# Patient Record
Sex: Female | Born: 1990 | Race: Black or African American | Hispanic: No | Marital: Single | Smoking: Former smoker
Health system: Southern US, Community
[De-identification: ages and names within clinical notes are randomized; demographics above are authoritative.]

## PROBLEM LIST (undated history)

## (undated) DIAGNOSIS — I10 Essential (primary) hypertension: Secondary | ICD-10-CM

## (undated) DIAGNOSIS — Z8742 Personal history of other diseases of the female genital tract: Secondary | ICD-10-CM

## (undated) DIAGNOSIS — R591 Generalized enlarged lymph nodes: Secondary | ICD-10-CM

## (undated) DIAGNOSIS — Z9189 Other specified personal risk factors, not elsewhere classified: Secondary | ICD-10-CM

## (undated) DIAGNOSIS — B009 Herpesviral infection, unspecified: Secondary | ICD-10-CM

## (undated) DIAGNOSIS — F32A Depression, unspecified: Secondary | ICD-10-CM

## (undated) HISTORY — DX: Other specified personal risk factors, not elsewhere classified: Z91.89

## (undated) HISTORY — DX: Generalized enlarged lymph nodes: R59.1

## (undated) HISTORY — DX: Personal history of other diseases of the female genital tract: Z87.42

## (undated) HISTORY — DX: Depression, unspecified: F32.A

---
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
## 2013-06-24 ENCOUNTER — Emergency Department: Payer: Self-pay | Admitting: Emergency Medicine

## 2013-09-11 ENCOUNTER — Emergency Department: Payer: Self-pay | Admitting: Emergency Medicine

## 2013-09-11 LAB — COMPREHENSIVE METABOLIC PANEL
Albumin: 3.8 g/dL (ref 3.4–5.0)
Alkaline Phosphatase: 51 U/L (ref 50–136)
Anion Gap: 4 — ABNORMAL LOW (ref 7–16)
Bilirubin,Total: 0.5 mg/dL (ref 0.2–1.0)
Calcium, Total: 9.3 mg/dL (ref 8.5–10.1)
Chloride: 108 mmol/L — ABNORMAL HIGH (ref 98–107)
Co2: 26 mmol/L (ref 21–32)
Creatinine: 0.97 mg/dL (ref 0.60–1.30)
EGFR (African American): >60
EGFR (Non-African Amer.): >60
Glucose: 89 mg/dL (ref 65–99)
Osmolality: 274 (ref 275–301)
Potassium: 4 mmol/L (ref 3.5–5.1)
SGOT(AST): 19 U/L (ref 15–37)
SGPT (ALT): 19 U/L (ref 12–78)
Total Protein: 7.8 g/dL (ref 6.4–8.2)

## 2013-09-11 LAB — URINALYSIS, COMPLETE
Bacteria: NONE SEEN
Bilirubin,UR: NEGATIVE
Glucose,UR: NEGATIVE mg/dL (ref 0–75)
Nitrite: NEGATIVE
Ph: 6 (ref 4.5–8.0)
RBC,UR: <1 /HPF (ref 0–5)
Squamous Epithelial: 1
WBC UR: 1 /HPF (ref 0–5)

## 2013-09-11 LAB — WET PREP, GENITAL

## 2013-09-11 LAB — CBC
HGB: 13.5 g/dL (ref 12.0–16.0)
MCHC: 33.2 g/dL (ref 32.0–36.0)
Platelet: 221 10*3/uL (ref 150–440)
RBC: 4.82 10*6/uL (ref 3.80–5.20)
WBC: 5.8 10*3/uL (ref 3.6–11.0)

## 2014-03-29 ENCOUNTER — Emergency Department: Payer: Self-pay | Admitting: Emergency Medicine

## 2015-01-01 ENCOUNTER — Emergency Department: Payer: Self-pay | Admitting: Emergency Medicine

## 2015-04-15 ENCOUNTER — Emergency Department: Admit: 2015-04-15 | Disposition: A | Discharge status: Home / Self Care | Payer: Self-pay | Admitting: Internal Medicine

## 2015-04-17 LAB — BETA STREP CULTURE(ARMC)

## 2015-05-19 ENCOUNTER — Encounter: Payer: Self-pay | Admitting: Emergency Medicine

## 2015-05-19 ENCOUNTER — Emergency Department
Admission: EM | Admit: 2015-05-19 | Discharge: 2015-05-19 | Disposition: A | Discharge status: Home / Self Care | Payer: Self-pay | Attending: Emergency Medicine | Admitting: Emergency Medicine

## 2015-05-19 DIAGNOSIS — N939 Abnormal uterine and vaginal bleeding, unspecified: Secondary | ICD-10-CM | POA: Insufficient documentation

## 2015-05-19 DIAGNOSIS — M545 Low back pain: Secondary | ICD-10-CM | POA: Insufficient documentation

## 2015-05-19 DIAGNOSIS — Z3202 Encounter for pregnancy test, result negative: Secondary | ICD-10-CM | POA: Insufficient documentation

## 2015-05-19 LAB — CBC
HCT: 43.6 % (ref 35.0–47.0)
Hemoglobin: 14.3 g/dL (ref 12.0–16.0)
MCH: 27.8 pg (ref 26.0–34.0)
MCHC: 32.8 g/dL (ref 32.0–36.0)
MCV: 84.8 fL (ref 80.0–100.0)
PLATELETS: 241 10*3/uL (ref 150–440)
RBC: 5.14 MIL/uL (ref 3.80–5.20)
RDW: 13.8 % (ref 11.5–14.5)
WBC: 7.2 10*3/uL (ref 3.6–11.0)

## 2015-05-19 LAB — URINALYSIS COMPLETE WITH MICROSCOPIC (ARMC ONLY)
BILIRUBIN URINE: NEGATIVE
Bacteria, UA: NONE SEEN
Glucose, UA: NEGATIVE mg/dL
Ketones, ur: NEGATIVE mg/dL
Leukocytes, UA: NEGATIVE
Nitrite: NEGATIVE
PROTEIN: NEGATIVE mg/dL
RBC / HPF: NONE SEEN RBC/hpf (ref 0–5)
Specific Gravity, Urine: 1.024 (ref 1.005–1.030)
pH: 5 (ref 5.0–8.0)

## 2015-05-19 LAB — CHLAMYDIA/NGC RT PCR (ARMC ONLY)
Chlamydia Tr: NOT DETECTED
N gonorrhoeae: NOT DETECTED

## 2015-05-19 LAB — WET PREP, GENITAL
Clue Cells Wet Prep HPF POC: NONE SEEN
Trich, Wet Prep: NONE SEEN
Yeast Wet Prep HPF POC: NONE SEEN

## 2015-05-19 LAB — POCT PREGNANCY, URINE: Preg Test, Ur: NEGATIVE

## 2015-05-19 MED ORDER — IBUPROFEN 800 MG PO TABS
800.0000 mg | ORAL_TABLET | Freq: Once | ORAL | Status: AC
  Administered 2015-05-19: 800 mg via ORAL

## 2015-05-19 MED ORDER — IBUPROFEN 800 MG PO TABS
ORAL_TABLET | ORAL | Status: AC
  Filled 2015-05-19: qty 1

## 2015-05-19 NOTE — ED Provider Notes (Signed)
[facility] Emergency Department Provider Note   ____________________________________________  Time seen: 3:00 PM I have reviewed the triage vital signs and the triage nursing note.  HISTORY  Chief Complaint Vaginal Bleeding   Historian Patient  HPI Paige White is a 24 y.o. female who is complaining of heavy vaginal leading for about 1 month. Symptoms are moderate. She did come due for her next dose of Depo-Provera injection in April, and did not get it. She has not had problems with irregular bleeding that she can remember, since she had been using Depo-Provera. She's had some generalized sleepiness, and generalized weakness, but no focal weakness. She has not been ill with a fever, headache, chest pain, shortness of breath, abdominal pain other than cramping recently. She's had no joint problems.     No past medical history on file.  obesity  There are no active problems to display for this patient.   No past surgical history on file.  No current outpatient prescriptions on file.  Depo-Provera, expired  Allergies Review of patient's allergies indicates no known allergies.  No family history on file.  Social History History  Substance Use Topics  . Smoking status: Never Smoker   . Smokeless tobacco: Not on file  . Alcohol Use: Yes    Review of Systems  Constitutional: Negative for fever. Eyes: Negative for visual changes. ENT: Negative for sore throat. Cardiovascular: Negative for chest pain. Respiratory: Negative for shortness of breath. Gastrointestinal: Positive for abdominal cramping and mild low back pain. Genitourinary: Negative for dysuria. Musculoskeletal: Negative for joint problems.  Skin: Negative for rash. Neurological: Negative for headaches, focal weakness or numbness.  ____________________________________________   PHYSICAL EXAM:  VITAL SIGNS: ED Triage Vitals  Enc Vitals Group     BP 05/19/15 1303 147/76  mmHg     Pulse Rate 05/19/15 1303 85     Resp 05/19/15 1303 18     Temp 05/19/15 1303 99.1 F (37.3 C)     Temp Source 05/19/15 1303 Oral     SpO2 05/19/15 1303 95 %     Weight 05/19/15 1303 206 lb (93.441 kg)     Height 05/19/15 1303 5\' 3"  (1.6 m)     Head Cir --      Peak Flow --      Pain Score 05/19/15 1304 7     Pain Loc --      Pain Edu? --      Excl. in [place]? --      Constitutional: Alert and oriented. Well appearing and in no distress. Eyes: Conjunctivae are normal. PERRL. Normal extraocular movements. ENT   Head: Normocephalic and atraumatic.   Nose: No congestion/rhinnorhea.   Mouth/Throat: Mucous membranes are moist.   Neck: No stridor. Cardiovascular: Normal rate, regular rhythm.  No murmurs, rubs, or gallops. Respiratory: Normal respiratory effort without tachypnea nor retractions. Breath sounds are clear and equal bilaterally. No wheezes/rales/rhonchi. Gastrointestinal: Soft and nontender. No distention.  Genitourinary: Scant vaginal bleeding, no vaginal discharge. No cervicitis. No adnexal masses or tenderness. Musculoskeletal: Nontender with normal range of motion in all extremities. No joint effusions.  No lower extremity tenderness nor edema. Neurologic:  Normal speech and language. No gross focal neurologic deficits are appreciated. Skin:  Skin is warm, dry and intact. No rash noted. Psychiatric: Mood and affect are normal. Speech and behavior are normal. Patient exhibits appropriate insight and judgment.  ____________________________________________   EKG  None ____________________________________________  LABS (pertinent positives/negatives)  Pregnancy test negative  White blood cell count 7.2 Hemoglobin 14.3 Urinalysis negative  ____________________________________________  RADIOLOGY Radiologist results reviewed  None __________________________________________  PROCEDURES  Procedure(s) performed: None Critical Care performed:  None  ____________________________________________   ED COURSE / ASSESSMENT AND PLAN  Pertinent labs & imaging results that were available during my care of the patient were reviewed by me and considered in my medical decision making (see chart for details).  Patient is overall well-appearing, with stable vital signs, and no anemia. She is having symptoms related to dysmenorrhea and menorrhagia, however she is stable. I will refer her to [place] [facility] for consideration of hormonal birth control options. Return precautions and discharge instructions provided the patient. She is comfortable and agreeable to this plan.    ___________________________________________   FINAL CLINICAL IMPRESSION(S) / ED DIAGNOSES   Final diagnoses:  Episode of heavy vaginal bleeding      Lisa Roca, MD 05/19/15 1523

## 2015-05-19 NOTE — Discharge Instructions (Signed)
You were evaluated for heavy vaginal bleeding for almost one month as Depo-Provera was due. We discussed her examine evaluation are reassuring today. I'm recommending follow-up with OB/GYN, or the health department for consideration of restarting Depo-Provera, or another hormonal birth control, as this will help with irregular bleeding as well as progressive prevention. Return to the emergency department for any new or worsening bleeding soaking through 1 pad per hour for 2-3 hours, pain, weakness, numbness, dizziness, or passing out.  Dysfunctional Uterine Bleeding Normally, menstrual periods begin between ages 50 to 75 in young women. A normal menstrual cycle/period may begin every 23 days up to 35 days and lasts from 1 to 7 days. Around 12 to 14 days before your menstrual period starts, ovulation (ovary produces an egg) occurs. When counting the time between menstrual periods, count from the first day of bleeding of the previous period to the first day of bleeding of the next period. Dysfunctional (abnormal) uterine bleeding is bleeding that is different from a normal menstrual period. Your periods may come earlier or later than usual. They may be lighter, have blood clots or be heavier. You may have bleeding between periods, or you may skip one period or more. You may have bleeding after sexual intercourse, bleeding after menopause, or no menstrual period. CAUSES   Pregnancy (normal, miscarriage, tubal).  IUDs (intrauterine device, birth control).  Birth control pills.  Hormone treatment.  Menopause.  Infection of the cervix.  Blood clotting problems.  Infection of the inside lining of the uterus.  Endometriosis, inside lining of the uterus growing in the pelvis and other female organs.  Adhesions (scar tissue) inside the uterus.  Obesity or severe weight loss.  Uterine polyps inside the uterus.  Cancer of the vagina, cervix, or uterus.  Ovarian cysts or polycystic ovary  syndrome.  Medical problems (diabetes, thyroid disease).  Uterine fibroids (noncancerous tumor).  Problems with your female hormones.  Endometrial hyperplasia, very thick lining and enlarged cells inside of the uterus.  Medicines that interfere with ovulation.  Radiation to the pelvis or abdomen.  Chemotherapy. DIAGNOSIS   Your doctor will discuss the history of your menstrual periods, medicines you are taking, changes in your weight, stress in your life, and any medical problems you may have.  Your doctor will do a physical and pelvic examination.  Your doctor may want to perform certain tests to make a diagnosis, such as:  Pap test.  Blood tests.  Cultures for infection.  CT scan.  Ultrasound.  Hysteroscopy.  Laparoscopy.  MRI.  Hysterosalpingography.  D and C.  Endometrial biopsy. TREATMENT  Treatment will depend on the cause of the dysfunctional uterine bleeding (DUB). Treatment may include:  Observing your menstrual periods for a couple of months.  Prescribing medicines for medical problems, including:  Antibiotics.  Hormones.  Birth control pills.  Removing an IUD (intrauterine device, birth control).  Surgery:  D and C (scrape and remove tissue from inside the uterus).  Laparoscopy (examine inside the abdomen with a lighted tube).  Uterine ablation (destroy lining of the uterus with electrical current, laser, heat, or freezing).  Hysteroscopy (examine cervix and uterus with a lighted tube).  Hysterectomy (remove the uterus). HOME CARE INSTRUCTIONS   If medicines were prescribed, take exactly as directed. Do not change or switch medicines without consulting your caregiver.  Long term heavy bleeding may result in iron deficiency. Your caregiver may have prescribed iron pills. They help replace the iron that your body lost from heavy bleeding.  Take exactly as directed.  Do not take aspirin or medicines that contain aspirin one week  before or during your menstrual period. Aspirin may make the bleeding worse.  If you need to change your sanitary pad or tampon more than once every 2 hours, stay in bed with your feet elevated and a cold pack on your lower abdomen. Rest as much as possible, until the bleeding stops or slows down.  Eat well-balanced meals. Eat foods high in iron. Examples are:  Leafy green vegetables.  Whole-grain breads and cereals.  Eggs.  Meat.  Liver.  Do not try to lose weight until the abnormal bleeding has stopped and your blood iron level is back to normal. Do not lift more than ten pounds or do strenuous activities when you are bleeding.  For a couple of months, make note on your calendar, marking the start and ending of your period, and the type of bleeding (light, medium, heavy, spotting, clots or missed periods). This is for your caregiver to better evaluate your problem. SEEK MEDICAL CARE IF:   You develop nausea (feeling sick to your stomach) and vomiting, dizziness, or diarrhea while you are taking your medicine.  You are getting lightheaded or weak.  You have any problems that may be related to the medicine you are taking.  You develop pain with your DUB.  You want to remove your IUD.  You want to stop or change your birth control pills or hormones.  You have any type of abnormal bleeding mentioned above.  You are over 79 years old and have not had a menstrual period yet.  You are 24 years old and you are still having menstrual periods.  You have any of the symptoms mentioned above.  You develop a rash. SEEK IMMEDIATE MEDICAL CARE IF:   An oral temperature above 102 F (38.9 C) develops.  You develop chills.  You are changing your sanitary pad or tampon more than once an hour.  You develop abdominal pain.  You pass out or faint. Document Released: 12/03/2000 Document Revised: 02/28/2012 Document Reviewed: 11/04/2009 [facility] Patient Information 2015 [place],  [place]. This information is not intended to replace advice given to you by your health care provider. Make sure you discuss any questions you have with your health care provider.

## 2015-05-19 NOTE — ED Notes (Signed)
C/o heavy menstrual bleeding x 29 days with lower abd. pain

## 2015-10-14 ENCOUNTER — Emergency Department
Admission: EM | Admit: 2015-10-14 | Discharge: 2015-10-14 | Disposition: A | Discharge status: Home / Self Care | Payer: Self-pay | Attending: Emergency Medicine | Admitting: Emergency Medicine

## 2015-10-14 ENCOUNTER — Encounter: Payer: Self-pay | Admitting: Emergency Medicine

## 2015-10-14 ENCOUNTER — Emergency Department: Payer: Self-pay

## 2015-10-14 DIAGNOSIS — R519 Headache, unspecified: Secondary | ICD-10-CM

## 2015-10-14 DIAGNOSIS — R51 Headache: Secondary | ICD-10-CM | POA: Insufficient documentation

## 2015-10-14 HISTORY — DX: Herpesviral infection, unspecified: B00.9

## 2015-10-14 MED ORDER — IBUPROFEN 800 MG PO TABS: 800.0000 mg | ORAL_TABLET | Freq: Three times a day (TID) | ORAL | Status: DC | PRN

## 2015-10-14 MED ORDER — PROMETHAZINE HCL 25 MG PO TABS: 25.0000 mg | ORAL_TABLET | Freq: Four times a day (QID) | ORAL | Status: DC | PRN

## 2015-10-14 MED ORDER — HYDROCODONE-ACETAMINOPHEN 5-325 MG PO TABS
2.0000 | ORAL_TABLET | Freq: Once | ORAL | Status: AC
  Administered 2015-10-14: 2 via ORAL
  Filled 2015-10-14: qty 2

## 2015-10-14 MED ORDER — KETOROLAC TROMETHAMINE 30 MG/ML IJ SOLN
30.0000 mg | Freq: Once | INTRAMUSCULAR | Status: AC
  Administered 2015-10-14: 30 mg via INTRAVENOUS
  Filled 2015-10-14: qty 1

## 2015-10-14 MED ORDER — METOCLOPRAMIDE HCL 10 MG PO TABS
10.0000 mg | ORAL_TABLET | Freq: Once | ORAL | Status: AC
  Administered 2015-10-14: 10 mg via ORAL
  Filled 2015-10-14: qty 1

## 2015-10-14 MED ORDER — BUTALBITAL-APAP-CAFFEINE 50-325-40 MG PO TABS: 1.0000 | ORAL_TABLET | Freq: Four times a day (QID) | ORAL | Status: DC | PRN

## 2015-10-14 NOTE — ED Provider Notes (Signed)
[facility] Emergency Department Provider Note  ____________________________________________  Time seen: Approximately 11:20 AM  I have reviewed the triage vital signs and the nursing notes.   HISTORY  Chief Complaint Headache    HPI Paige White is a 24 y.o. female who presents for evaluation of headache for 3 weeks. Patient states it's worse today than it has been the last 3 weeks. Has tried over-the-counter medication with no relief. Denies any blurred vision or visual changes. Patient states that the headache is worsened by orders and sound. Past medical history of headaches. Describes pain as a 10 over 10 at this point.   Past Medical History  Diagnosis Date  . Herpes     There are no active problems to display for this patient.   History reviewed. No pertinent past surgical history.  Current Outpatient Rx  Name  Route  Sig  Dispense  Refill  . butalbital-acetaminophen-caffeine (FIORICET) 50-325-40 MG tablet   Oral   Take 1-2 tablets by mouth every 6 (six) hours as needed for headache.   20 tablet   0   . ibuprofen (ADVIL,MOTRIN) 800 MG tablet   Oral   Take 1 tablet (800 mg total) by mouth every 8 (eight) hours as needed.   30 tablet   0   . promethazine (PHENERGAN) 25 MG tablet   Oral   Take 1 tablet (25 mg total) by mouth every 6 (six) hours as needed for nausea or vomiting.   30 tablet   0     Allergies Review of patient's allergies indicates no known allergies.  History reviewed. No pertinent family history.  Social History Social History  Substance Use Topics  . Smoking status: Never Smoker   . Smokeless tobacco: None  . Alcohol Use: Yes    Review of Systems Constitutional: No fever/chills Eyes: No visual changes. ENT: No sore throat. Cardiovascular: Denies chest pain. Respiratory: Denies shortness of breath. Gastrointestinal: No abdominal pain.  No nausea, no vomiting.  No diarrhea.  No  constipation. Genitourinary: Negative for dysuria. Musculoskeletal: Negative for back pain. Skin: Negative for rash. Neurological: Positive for headaches, negative for focal weakness or numbness.  10-point ROS otherwise negative.  ____________________________________________   PHYSICAL EXAM:  VITAL SIGNS: ED Triage Vitals  Enc Vitals Group     BP 10/14/15 1018 125/80 mmHg     Pulse Rate 10/14/15 1018 60     Resp 10/14/15 1018 20     Temp 10/14/15 1018 98.4 F (36.9 C)     Temp Source 10/14/15 1018 Oral     SpO2 10/14/15 1018 100 %     Weight 10/14/15 1018 203 lb (92.08 kg)     Height 10/14/15 1018 5\' 3"  (1.6 m)     Head Cir --      Peak Flow --      Pain Score 10/14/15 1026 10     Pain Loc --      Pain Edu? --      Excl. in [place]? --     Constitutional: Alert and oriented. Well appearing and in no acute distress. Eyes: Conjunctivae are normal. PERRL. EOMI. Head: Atraumatic. Nose: No congestion/rhinnorhea. Mouth/Throat: Mucous membranes are moist.  Oropharynx non-erythematous. Neck: No stridor.  Negative spinal tenderness. Cardiovascular: Normal rate, regular rhythm. Grossly normal heart sounds.  Good peripheral circulation. Respiratory: Normal respiratory effort.  No retractions. Lungs CTAB. Gastrointestinal: Soft and nontender. No distention. No abdominal bruits. No CVA tenderness. Musculoskeletal: No lower extremity tenderness nor  edema.  No joint effusions. Neurologic:  Normal speech and language. No gross focal neurologic deficits are appreciated. No gait instability. Skin:  Skin is warm, dry and intact. No rash noted. Psychiatric: Mood and affect are normal. Speech and behavior are normal.  ____________________________________________   LABS (all labs ordered are listed, but only abnormal results are displayed)  Labs Reviewed - No data to display ____________________________________________  RADIOLOGY  Head CT  negative. ____________________________________________   PROCEDURES  Procedure(s) performed: None  Critical Care performed: No  ____________________________________________   INITIAL IMPRESSION / ASSESSMENT AND PLAN / ED COURSE  Pertinent labs & imaging results that were available during my care of the patient were reviewed by me and considered in my medical decision making (see chart for details).  Initial treatment in the ER was IV saline lock with ketorolac 30 mg IV push, Reglan 10 mg, hydrocodone 5/325 #2 given. Patient reports relief with previous medication. Rx provided for Fioricet, Motrin 800, promethazine as needed for nausea. Work excuse given times one day she is to follow up with her PCP or return to the ER with any worsening symptomology. No other emergency medical complaints at this time. ____________________________________________   FINAL CLINICAL IMPRESSION(S) / ED DIAGNOSES  Final diagnoses:  Acute nonintractable headache, unspecified headache type      Arlyss Repress, PA-C 10/14/15 1308  Earleen Newport, MD 10/14/15 [phone]

## 2015-10-14 NOTE — Discharge Instructions (Signed)

## 2015-10-14 NOTE — ED Notes (Signed)
Pt to ed with c/o headache x 3 weeks, pt states today worse than last three weeks,  States has tried over the counter headache pills without relief.  Denies blurred vision,  Pt states odors make headache worse, also reports increase in pain with loud noise.

## 2015-10-14 NOTE — ED Notes (Signed)
States she has had a headache for about 2-3 weeks   denies any fever injury or nausea or vomiting.  Min relief with OTC meds  States pain  Is frontal area

## 2015-11-18 ENCOUNTER — Emergency Department
Admission: EM | Admit: 2015-11-18 | Discharge: 2015-11-18 | Disposition: A | Discharge status: Home / Self Care | Payer: Self-pay | Attending: Emergency Medicine | Admitting: Emergency Medicine

## 2015-11-18 ENCOUNTER — Encounter: Payer: Self-pay | Admitting: Emergency Medicine

## 2015-11-18 DIAGNOSIS — Z3202 Encounter for pregnancy test, result negative: Secondary | ICD-10-CM | POA: Insufficient documentation

## 2015-11-18 DIAGNOSIS — N938 Other specified abnormal uterine and vaginal bleeding: Secondary | ICD-10-CM | POA: Insufficient documentation

## 2015-11-18 LAB — CBC WITH DIFFERENTIAL/PLATELET
Basophils Absolute: 0 10*3/uL (ref 0–0.1)
Basophils Relative: 1 %
EOS ABS: 0.1 10*3/uL (ref 0–0.7)
EOS PCT: 2 %
HCT: 43.2 % (ref 35.0–47.0)
Hemoglobin: 14 g/dL (ref 12.0–16.0)
Lymphocytes Relative: 49 %
Lymphs Abs: 2.7 10*3/uL (ref 1.0–3.6)
MCH: 27.3 pg (ref 26.0–34.0)
MCHC: 32.5 g/dL (ref 32.0–36.0)
MCV: 83.8 fL (ref 80.0–100.0)
MONOS PCT: 7 %
Monocytes Absolute: 0.4 10*3/uL (ref 0.2–0.9)
Neutro Abs: 2.2 10*3/uL (ref 1.4–6.5)
Neutrophils Relative %: 41 %
PLATELETS: 246 10*3/uL (ref 150–440)
RBC: 5.15 MIL/uL (ref 3.80–5.20)
RDW: 13.5 % (ref 11.5–14.5)
WBC: 5.3 10*3/uL (ref 3.6–11.0)

## 2015-11-18 LAB — COMPREHENSIVE METABOLIC PANEL
ALT: 14 U/L (ref 14–54)
ANION GAP: 6 (ref 5–15)
AST: 15 U/L (ref 15–41)
Albumin: 3.8 g/dL (ref 3.5–5.0)
Alkaline Phosphatase: 47 U/L (ref 38–126)
BUN: 13 mg/dL (ref 6–20)
CALCIUM: 9.6 mg/dL (ref 8.9–10.3)
CHLORIDE: 106 mmol/L (ref 101–111)
CO2: 25 mmol/L (ref 22–32)
Creatinine, Ser: 0.94 mg/dL (ref 0.44–1.00)
GFR calc non Af Amer: >60 mL/min (ref >60)
GLUCOSE: 95 mg/dL (ref 65–99)
POTASSIUM: 4.3 mmol/L (ref 3.5–5.1)
SODIUM: 137 mmol/L (ref 135–145)
TOTAL PROTEIN: 8.1 g/dL (ref 6.5–8.1)
Total Bilirubin: 0.9 mg/dL (ref 0.3–1.2)

## 2015-11-18 LAB — URINALYSIS COMPLETE WITH MICROSCOPIC (ARMC ONLY)
Bilirubin Urine: NEGATIVE
GLUCOSE, UA: NEGATIVE mg/dL
Ketones, ur: NEGATIVE mg/dL
Nitrite: NEGATIVE
Protein, ur: NEGATIVE mg/dL
Specific Gravity, Urine: 1.006 (ref 1.005–1.030)
pH: 6 (ref 5.0–8.0)

## 2015-11-18 LAB — POCT PREGNANCY, URINE: PREG TEST UR: NEGATIVE

## 2015-11-18 LAB — LIPASE, BLOOD: LIPASE: 25 U/L (ref 11–51)

## 2015-11-18 NOTE — ED Provider Notes (Signed)
[facility] Emergency Department Provider Note  ____________________________________________   I have reviewed the triage vital signs and the nursing notes.   HISTORY  Chief Complaint Abdominal Pain and Vaginal Bleeding    HPI Paige White is a 24 y.o. female who states that essentially she is here for a Depo-Provera shot. She was scheduled to get her Depo-Provera shot tomorrow at the pharmacy but they informed her there was a problem with her insurance. Patient is due for her Depo-Provera shot. Her last Depo-Provera shot was 3 or 4 months ago she states. Records the end of her Depo-Provera shot period, The patient has increased vaginal bleeding. She does not feel lightheaded or have any secondary concerns. She is not having vaginal discharge she does not want a pelvic exam. She would like a Depo-Provera shot. She states that she goes to the [facility]. Initially she said she is using 3 pads an hour but when I questioned her more closely in terms that she is only used 1 tampon today and had to use 3 tampons yesterday. Patient is using her cell phone and needs to be prompted several times to answer questions.  Past Medical History  Diagnosis Date  . Herpes     There are no active problems to display for this patient.   History reviewed. No pertinent past surgical history.  Current Outpatient Rx  Name  Route  Sig  Dispense  Refill  . butalbital-acetaminophen-caffeine (FIORICET) 50-325-40 MG tablet   Oral   Take 1-2 tablets by mouth every 6 (six) hours as needed for headache.   20 tablet   0   . ibuprofen (ADVIL,MOTRIN) 800 MG tablet   Oral   Take 1 tablet (800 mg total) by mouth every 8 (eight) hours as needed.   30 tablet   0   . promethazine (PHENERGAN) 25 MG tablet   Oral   Take 1 tablet (25 mg total) by mouth every 6 (six) hours as needed for nausea or vomiting.   30 tablet   0     Allergies Review of patient's allergies indicates no  known allergies.  No family history on file.  Social History Social History  Substance Use Topics  . Smoking status: Never Smoker   . Smokeless tobacco: None  . Alcohol Use: Yes    Review of Systems Constitutional: No fever/chills Eyes: No visual changes. ENT: No sore throat. No stiff neck no neck pain Cardiovascular: Denies chest pain. Respiratory: Denies shortness of breath. Gastrointestinal:   no vomiting.  No diarrhea.  No constipation. Genitourinary: Negative for dysuria. Musculoskeletal: Negative lower extremity swelling Skin: Negative for rash. Neurological: Negative for headaches, focal weakness or numbness. 10-point ROS otherwise negative.  ____________________________________________   PHYSICAL EXAM:  VITAL SIGNS: ED Triage Vitals  Enc Vitals Group     BP 11/18/15 1104 118/84 mmHg     Pulse Rate 11/18/15 1104 65     Resp 11/18/15 1104 18     Temp 11/18/15 1104 98.2 F (36.8 C)     Temp Source 11/18/15 1104 Oral     SpO2 11/18/15 1104 99 %     Weight 11/18/15 1104 203 lb (92.08 kg)     Height 11/18/15 1104 5\' 3"  (1.6 m)     Head Cir --      Peak Flow --      Pain Score 11/18/15 1111 10     Pain Loc --      Pain Edu? --  Excl. in [place]? --     Constitutional: Alert and oriented. Well appearing and in no acute distress. Using her cell phone Eyes: Conjunctivae are normal. PERRL. EOMI. Head: Atraumatic. Neck: No stridor.   Nontender with no meningismus Cardiovascular: Normal rate, regular rhythm. Grossly normal heart sounds.  Good peripheral circulation. Respiratory: Normal respiratory effort.  No retractions. Lungs CTAB. Abdominal: Soft and nontender. No distention. No guarding no rebound Back:  There is no focal tenderness or step off there is no midline tenderness there are no lesions noted. there is no CVA tenderness GU: Patient declines offered pelvic exam Musculoskeletal: No lower extremity tenderness. No joint effusions, no DVT signs strong  distal pulses no edema Neurologic:  Normal speech and language. No gross focal neurologic deficits are appreciated.  Skin:  Skin is warm, dry and intact. No rash noted. Psychiatric: Mood and affect are normal. Speech and behavior are normal.  ____________________________________________   LABS (all labs ordered are listed, but only abnormal results are displayed)  Labs Reviewed  URINALYSIS COMPLETEWITH MICROSCOPIC ([place]) - Abnormal; Notable for the following:    Color, Urine STRAW (*)    APPearance CLEAR (*)    Hgb urine dipstick 3+ (*)    Leukocytes, UA TRACE (*)    Bacteria, UA FEW (*)    Squamous Epithelial / LPF 0-5 (*)    All other components within normal limits  CBC WITH DIFFERENTIAL/PLATELET  COMPREHENSIVE METABOLIC PANEL  LIPASE, BLOOD  POC URINE PREG, ED  POCT PREGNANCY, URINE   ____________________________________________  EKG  I personally interpreted any EKGs ordered by me or triage  ____________________________________________  RADIOLOGY  I reviewed any imaging ordered by me or triage that were performed during my shift ____________________________________________   PROCEDURES  Procedure(s) performed: None  Critical Care performed: None  ____________________________________________   INITIAL IMPRESSION / ASSESSMENT AND PLAN / ED COURSE  Pertinent labs & imaging results that were available during my care of the patient were reviewed by me and considered in my medical decision making (see chart for details).  Patient with vaginal bleeding but no evidence of anemia no evidence of pregnancy, and this is certainly because she is due for her Depo-Provera shot. I was went to her that we do not provide to compare shots. She states that she will follow-up with her OB/GYN. Return precautions and follow-up given. We did offer the patient a pelvic exam and she declined. Patient was using her cell phone during the entirety of the interview. Her abdomen is  benign ____________________________________________   FINAL CLINICAL IMPRESSION(S) / ED DIAGNOSES  Final diagnoses:  None     Schuyler Amor, MD 11/18/15 1426

## 2015-11-18 NOTE — ED Notes (Signed)
States has been menstruating x 2 weeks.  C/o lower abdominal pain x 2 weeks.  States vaginal bleeding is heavy and using 3 pads per hour.  Reports similar episode of vaginal bleeding in 2013, cannot recall what diagnosis was at that time.

## 2015-11-18 NOTE — Discharge Instructions (Signed)

## 2016-03-09 ENCOUNTER — Emergency Department: Payer: Self-pay

## 2016-03-09 ENCOUNTER — Emergency Department
Admission: EM | Admit: 2016-03-09 | Discharge: 2016-03-09 | Disposition: A | Discharge status: Home / Self Care | Payer: Self-pay | Attending: Emergency Medicine | Admitting: Emergency Medicine

## 2016-03-09 DIAGNOSIS — R1031 Right lower quadrant pain: Secondary | ICD-10-CM | POA: Insufficient documentation

## 2016-03-09 DIAGNOSIS — R0789 Other chest pain: Secondary | ICD-10-CM | POA: Insufficient documentation

## 2016-03-09 DIAGNOSIS — R52 Pain, unspecified: Secondary | ICD-10-CM

## 2016-03-09 DIAGNOSIS — R140 Abdominal distension (gaseous): Secondary | ICD-10-CM | POA: Insufficient documentation

## 2016-03-09 LAB — URINALYSIS COMPLETE WITH MICROSCOPIC (ARMC ONLY)
Bilirubin Urine: NEGATIVE
GLUCOSE, UA: NEGATIVE mg/dL
Ketones, ur: NEGATIVE mg/dL
LEUKOCYTES UA: NEGATIVE
NITRITE: NEGATIVE
Protein, ur: NEGATIVE mg/dL
SPECIFIC GRAVITY, URINE: 1.011 (ref 1.005–1.030)
pH: 6 (ref 5.0–8.0)

## 2016-03-09 LAB — CBC WITH DIFFERENTIAL/PLATELET
BASOS PCT: 1 %
Basophils Absolute: 0.1 10*3/uL (ref 0–0.1)
Eosinophils Absolute: 0.1 10*3/uL (ref 0–0.7)
Eosinophils Relative: 2 %
HCT: 42 % (ref 35.0–47.0)
HEMOGLOBIN: 13.6 g/dL (ref 12.0–16.0)
Lymphocytes Relative: 51 %
Lymphs Abs: 3.8 10*3/uL — ABNORMAL HIGH (ref 1.0–3.6)
MCH: 27.3 pg (ref 26.0–34.0)
MCHC: 32.5 g/dL (ref 32.0–36.0)
MCV: 83.9 fL (ref 80.0–100.0)
Monocytes Absolute: 0.4 10*3/uL (ref 0.2–0.9)
Monocytes Relative: 5 %
NEUTROS ABS: 3.1 10*3/uL (ref 1.4–6.5)
NEUTROS PCT: 41 %
Platelets: 238 10*3/uL (ref 150–440)
RBC: 5 MIL/uL (ref 3.80–5.20)
RDW: 13.4 % (ref 11.5–14.5)
WBC: 7.5 10*3/uL (ref 3.6–11.0)

## 2016-03-09 LAB — COMPREHENSIVE METABOLIC PANEL
ALBUMIN: 4.1 g/dL (ref 3.5–5.0)
ALT: 17 U/L (ref 14–54)
AST: 24 U/L (ref 15–41)
Alkaline Phosphatase: 46 U/L (ref 38–126)
Anion gap: 7 (ref 5–15)
BUN: 10 mg/dL (ref 6–20)
CHLORIDE: 104 mmol/L (ref 101–111)
CO2: 25 mmol/L (ref 22–32)
CREATININE: 0.92 mg/dL (ref 0.44–1.00)
Calcium: 9.3 mg/dL (ref 8.9–10.3)
GFR calc Af Amer: >60 mL/min (ref >60)
GFR calc non Af Amer: >60 mL/min (ref >60)
GLUCOSE: 89 mg/dL (ref 65–99)
Potassium: 4 mmol/L (ref 3.5–5.1)
Sodium: 136 mmol/L (ref 135–145)
Total Bilirubin: <0.1 mg/dL — ABNORMAL LOW (ref 0.3–1.2)
Total Protein: 8 g/dL (ref 6.5–8.1)

## 2016-03-09 LAB — POCT PREGNANCY, URINE: PREG TEST UR: NEGATIVE

## 2016-03-09 LAB — TROPONIN I

## 2016-03-09 MED ORDER — RANITIDINE HCL 150 MG PO TABS: 150.0000 mg | ORAL_TABLET | Freq: Two times a day (BID) | ORAL | Status: DC

## 2016-03-09 NOTE — ED Notes (Addendum)
See triage. Developed some discomfort after eating at [employer]  abd cramping and diarrhea on sat.  Now having non prod cough and discomfort in chest with cough and deep breathing

## 2016-03-09 NOTE — ED Provider Notes (Signed)
[facility] Emergency Department Provider Note  ____________________________________________  Time seen: Approximately 12:24 PM  I have reviewed the triage vital signs and the nursing notes.   HISTORY  Chief Complaint Abdominal Pain   HPI Paige White is a 25 y.o. female is here with complaint to triage that she is having generalized abdominal cramping with some diarrhea since Saturday night. Patient states that the diarrhea has calmed down and that she has not had abdominal pain but rather it is chest pain. She states she is having a nonproductive cough with discomfort with cough and deep breathing. She states that she ate at [facility] Saturday evening and has had chest pain/abdominal pain since that time. She also has had some bloating and some gas-like sensations. She has taken some over-the-counter gas medication with minimal relief. She denies any nausea or vomiting. She denies any fever or chills.At the time of exam she rates her pain a 0/10.   Past Medical History  Diagnosis Date  . Herpes     There are no active problems to display for this patient.   History reviewed. No pertinent past surgical history.  Current Outpatient Rx  Name  Route  Sig  Dispense  Refill  . butalbital-acetaminophen-caffeine (FIORICET) 50-325-40 MG tablet   Oral   Take 1-2 tablets by mouth every 6 (six) hours as needed for headache.   20 tablet   0   . ibuprofen (ADVIL,MOTRIN) 800 MG tablet   Oral   Take 1 tablet (800 mg total) by mouth every 8 (eight) hours as needed.   30 tablet   0   . promethazine (PHENERGAN) 25 MG tablet   Oral   Take 1 tablet (25 mg total) by mouth every 6 (six) hours as needed for nausea or vomiting.   30 tablet   0   . ranitidine (ZANTAC) 150 MG tablet   Oral   Take 1 tablet (150 mg total) by mouth 2 (two) times daily.   30 tablet   1     Allergies Review of patient's allergies indicates no known allergies.  No family history  on file.  Social History Social History  Substance Use Topics  . Smoking status: Never Smoker   . Smokeless tobacco: None  . Alcohol Use: Yes    Review of Systems Constitutional: No fever/chills ENT: No sore throat. Cardiovascular: Positive chest pain. Respiratory: Denies shortness of breath. Gastrointestinal: Positive abdominal pain.  No nausea, no vomiting. Positive diarrhea.  No constipation. Genitourinary: Negative for dysuria. Musculoskeletal: Negative for back pain. Skin: Negative for rash. Neurological: Negative for headaches, focal weakness or numbness.  10-point ROS otherwise negative.  ____________________________________________   PHYSICAL EXAM:  VITAL SIGNS: ED Triage Vitals  Enc Vitals Group     BP 03/09/16 1140 140/67 mmHg     Pulse Rate 03/09/16 1140 86     Resp 03/09/16 1140 16     Temp 03/09/16 1140 99 F (37.2 C)     Temp Source 03/09/16 1140 Oral     SpO2 03/09/16 1140 98 %     Weight 03/09/16 1140 225 lb (102.059 kg)     Height 03/09/16 1140 5\' 3"  (1.6 m)     Head Cir --      Peak Flow --      Pain Score 03/09/16 1140 0     Pain Loc --      Pain Edu? --      Excl. in [place]? --  Constitutional: Alert and oriented. Well appearing and in no acute distress.Patient is lying on stretcher on her left side, moderately obese, no apparent distress. Eyes: Conjunctivae are normal. PERRL. EOMI. Head: Atraumatic. Nose: No congestion/rhinnorhea. Mouth/Throat: Mucous membranes are moist.  Oropharynx non-erythematous. Neck: No stridor.   Hematological/Lymphatic/Immunilogical: No cervical lymphadenopathy. Cardiovascular: Normal rate, regular rhythm. Grossly normal heart sounds.  Good peripheral circulation. Respiratory: Normal respiratory effort.  No retractions. Lungs CTAB. Gastrointestinal: Soft, obese with minimal tenderness on palpation epigastric and right upper quadrant area. There is no distention. Bowel sounds are normoactive 4 quadrants. No right  lower quadrant pain. Musculoskeletal: Moves upper and lower extremities without any difficulty. No edema noted lower extremities. Neurologic:  Normal speech and language. No gross focal neurologic deficits are appreciated. No gait instability. Skin:  Skin is warm, dry and intact. No rash noted. Psychiatric: Mood and affect are normal. Speech and behavior are normal.  ____________________________________________   LABS (all labs ordered are listed, but only abnormal results are displayed)  Labs Reviewed  CBC WITH DIFFERENTIAL/PLATELET - Abnormal; Notable for the following:    Lymphs Abs 3.8 (*)    All other components within normal limits  URINALYSIS COMPLETEWITH MICROSCOPIC ([facility] ONLY) - Abnormal; Notable for the following:    Color, Urine YELLOW (*)    APPearance CLEAR (*)    Hgb urine dipstick 1+ (*)    Bacteria, UA RARE (*)    Squamous Epithelial / LPF 0-5 (*)    All other components within normal limits  COMPREHENSIVE METABOLIC PANEL - Abnormal; Notable for the following:    Total Bilirubin <0.1 (*)    All other components within normal limits  TROPONIN I  POC URINE PREG, ED  POCT PREGNANCY, URINE   ____________________________________________  EKG  Per Dr. Joni Fears ____________________________________________  RADIOLOGY  X-ray per radiology was normal. Ultrasound abdomen shows no acute abnormality per radiologist.   PROCEDURES  Procedure(s) performed: None  Critical Care performed: No  ____________________________________________   INITIAL IMPRESSION / ASSESSMENT AND PLAN / ED COURSE  Pertinent labs & imaging results that were available during my care of the patient were reviewed by me and considered in my medical decision making (see chart for details).  Patient was discharged with prescription for Zantac 150 mg 1 twice a day and follow-up with Dr. Vira Agar gastroenterologist if any continued problems or see her primary care  doctor. ____________________________________________   FINAL CLINICAL IMPRESSION(S) / ED DIAGNOSES  Final diagnoses:  Bloating  Pain  Anterior chest wall pain  Abdominal bloating      Johnn Hai, PA-C 03/09/16 [place], PA-C 03/09/16 [place], MD 03/11/16 2247

## 2016-03-09 NOTE — Discharge Instructions (Signed)
Take Zantac as directed. Follow-up with your doctor is [facility] if any continued problems. Do not eat any fried food for 2 weeks while on medication.

## 2016-03-09 NOTE — ED Provider Notes (Signed)
EKG interpreted by me Normal sinus rhythm rate of 73, normal axis and intervals, normal QRS ST segments and T waves. There is respiratory variation evident on the strip.  Carrie Mew, MD 03/09/16 [phone]

## 2016-03-09 NOTE — ED Notes (Signed)
Pt states she had N/V/D Saturday after eating at the [facility], states since she has had generalized abd cramping with some diarrhea, denies any N/V since Saturday.. States the diarrhea has calmed down .Marland Kitchen Pt is in NAD, respirations WNL.

## 2016-04-16 ENCOUNTER — Encounter: Payer: Self-pay | Admitting: Emergency Medicine

## 2016-04-16 ENCOUNTER — Emergency Department
Admission: EM | Admit: 2016-04-16 | Discharge: 2016-04-16 | Disposition: A | Discharge status: Home / Self Care | Payer: Self-pay | Attending: Emergency Medicine | Admitting: Emergency Medicine

## 2016-04-16 DIAGNOSIS — Z791 Long term (current) use of non-steroidal anti-inflammatories (NSAID): Secondary | ICD-10-CM | POA: Insufficient documentation

## 2016-04-16 DIAGNOSIS — F1290 Cannabis use, unspecified, uncomplicated: Secondary | ICD-10-CM | POA: Insufficient documentation

## 2016-04-16 DIAGNOSIS — F17210 Nicotine dependence, cigarettes, uncomplicated: Secondary | ICD-10-CM | POA: Insufficient documentation

## 2016-04-16 DIAGNOSIS — M5410 Radiculopathy, site unspecified: Secondary | ICD-10-CM | POA: Insufficient documentation

## 2016-04-16 DIAGNOSIS — Z79899 Other long term (current) drug therapy: Secondary | ICD-10-CM | POA: Insufficient documentation

## 2016-04-16 LAB — COMPREHENSIVE METABOLIC PANEL
ALT: 14 U/L (ref 14–54)
AST: 18 U/L (ref 15–41)
Albumin: 3.9 g/dL (ref 3.5–5.0)
Alkaline Phosphatase: 42 U/L (ref 38–126)
Anion gap: 10 (ref 5–15)
BILIRUBIN TOTAL: 0.6 mg/dL (ref 0.3–1.2)
BUN: 12 mg/dL (ref 6–20)
CO2: 22 mmol/L (ref 22–32)
Calcium: 9.6 mg/dL (ref 8.9–10.3)
Chloride: 105 mmol/L (ref 101–111)
Creatinine, Ser: 0.89 mg/dL (ref 0.44–1.00)
Glucose, Bld: 86 mg/dL (ref 65–99)
Potassium: 4 mmol/L (ref 3.5–5.1)
Sodium: 137 mmol/L (ref 135–145)
Total Protein: 7.9 g/dL (ref 6.5–8.1)

## 2016-04-16 LAB — CBC
HEMATOCRIT: 41 % (ref 35.0–47.0)
Hemoglobin: 13.3 g/dL (ref 12.0–16.0)
MCH: 26.9 pg (ref 26.0–34.0)
MCHC: 32.4 g/dL (ref 32.0–36.0)
MCV: 83.1 fL (ref 80.0–100.0)
Platelets: 264 10*3/uL (ref 150–440)
RBC: 4.93 MIL/uL (ref 3.80–5.20)
RDW: 13.4 % (ref 11.5–14.5)
WBC: 6.8 10*3/uL (ref 3.6–11.0)

## 2016-04-16 LAB — TROPONIN I

## 2016-04-16 MED ORDER — NAPROXEN 500 MG PO TABS: 500.0000 mg | ORAL_TABLET | Freq: Two times a day (BID) | ORAL | Status: DC

## 2016-04-16 NOTE — Discharge Instructions (Signed)
Radicular Pain °Radicular pain in either the arm or leg is usually from a bulging or herniated disk in the spine. A piece of the herniated disk may press against the nerves as the nerves exit the spine. This causes pain which is felt at the tips of the nerves down the arm or leg. Other causes of radicular pain may include: °· Fractures. °· Heart disease. °· Cancer. °· An abnormal and usually degenerative state of the nervous system or nerves (neuropathy). °Diagnosis may require CT or MRI scanning to determine the primary cause.  °Nerves that start at the neck (nerve roots) may cause radicular pain in the outer shoulder and arm. It can spread down to the thumb and fingers. The symptoms vary depending on which nerve root has been affected. In most cases radicular pain improves with conservative treatment. Neck problems may require physical therapy, a neck collar, or cervical traction. Treatment may take many weeks, and surgery may be considered if the symptoms do not improve.  °Conservative treatment is also recommended for sciatica. Sciatica causes pain to radiate from the lower back or buttock area down the leg into the foot. Often there is a history of back problems. Most patients with sciatica are better after 2 to 4 weeks of rest and other supportive care. Short term bed rest can reduce the disk pressure considerably. Sitting, however, is not a good position since this increases the pressure on the disk. You should avoid bending, lifting, and all other activities which make the problem worse. Traction can be used in severe cases. Surgery is usually reserved for patients who do not improve within the first months of treatment. °Only take over-the-counter or prescription medicines for pain, discomfort, or fever as directed by your caregiver. Narcotics and muscle relaxants may help by relieving more severe pain and spasm and by providing mild sedation. Cold or massage can give significant relief. Spinal manipulation  is not recommended. It can increase the degree of disc protrusion. Epidural steroid injections are often effective treatment for radicular pain. These injections deliver medicine to the spinal nerve in the space between the protective covering of the spinal cord and back bones (vertebrae). Your caregiver can give you more information about steroid injections. These injections are most effective when given within two weeks of the onset of pain.  °You should see your caregiver for follow up care as recommended. A program for neck and back injury rehabilitation with stretching and strengthening exercises is an important part of management.  °SEEK IMMEDIATE MEDICAL CARE IF: °· You develop increased pain, weakness, or numbness in your arm or leg. °· You develop difficulty with bladder or bowel control. °· You develop abdominal pain. °  °This information is not intended to replace advice given to you by your health care provider. Make sure you discuss any questions you have with your health care provider. °  °Document Released: 01/13/2005 Document Revised: 12/27/2014 Document Reviewed: 07/02/2015 °Elsevier Interactive Patient Education ©2016 [employer] ° °

## 2016-04-16 NOTE — ED Provider Notes (Signed)
[facility] Emergency Department Provider Note  ____________________________________________    I have reviewed the triage vital signs and the nursing notes.   HISTORY  Chief Complaint Extremity Pain    HPI Paige White is a 25 y.o. female who presents with complaints of shooting pains down her right arm. She also reports that at one point she had right-sided chest pain as well. 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 She denies shortness of breath. She reports the pain is sharp and shoots from her shoulder down to her fingertips. She has never had this before. She denies neck pain or neck injury. No neuro deficits. No headache.     Past Medical History  Diagnosis Date  . Herpes     There are no active problems to display for this patient.   History reviewed. No pertinent past surgical history.  Current Outpatient Rx  Name  Route  Sig  Dispense  Refill  . butalbital-acetaminophen-caffeine (FIORICET) 50-325-40 MG tablet   Oral   Take 1-2 tablets by mouth every 6 (six) hours as needed for headache.   20 tablet   0   . ibuprofen (ADVIL,MOTRIN) 800 MG tablet   Oral   Take 1 tablet (800 mg total) by mouth every 8 (eight) hours as needed.   30 tablet   0   . promethazine (PHENERGAN) 25 MG tablet   Oral   Take 1 tablet (25 mg total) by mouth every 6 (six) hours as needed for nausea or vomiting.   30 tablet   0   . ranitidine (ZANTAC) 150 MG tablet   Oral   Take 1 tablet (150 mg total) by mouth 2 (two) times daily.   30 tablet   1     Allergies Review of patient's allergies indicates no known allergies.  History reviewed. No pertinent family history.  Social History Social History  Substance Use Topics  . Smoking status: Current Every Day Smoker -- 0.50 packs/day    Types: Cigarettes  . Smokeless tobacco: None  . Alcohol Use: Yes    Review of  Systems  Constitutional: Negative for dizziness. Eyes: Negative for revision ENT: Negative for sore throat Cardiovascular: As above Respiratory: Negative for shortness of breath. Gastrointestinal: Negative for abdominal pain Genitourinary: Negative for dysuria. Musculoskeletal: Negative for back pain. Arm pain as above Skin: Negative for rash. Neurological: Negative for focal weakness, no headache Psychiatric: no anxiety    ____________________________________________   PHYSICAL EXAM:  VITAL SIGNS: ED Triage Vitals  Enc Vitals Group     BP 04/16/16 1430 135/67 mmHg     Pulse Rate 04/16/16 1430 82     Resp 04/16/16 1430 18     Temp 04/16/16 1430 98.2 F (36.8 C)     Temp Source 04/16/16 1430 Oral     SpO2 04/16/16 1430 100 %     Weight 04/16/16 1430 240 lb (108.863 kg)     Height 04/16/16 1430 5\' 3"  (1.6 m)     Head Cir --      Peak Flow --      Pain Score 04/16/16 1428 10     Pain Loc --      Pain Edu? --      Excl. in [place]? --      Constitutional: Alert and oriented. Well appearing and in no distress.  Eyes: Conjunctivae are normal. No erythema or  injection ENT   Head: Normocephalic and atraumatic.   Mouth/Throat: Mucous membranes are moist. Cardiovascular: Normal rate, regular rhythm. Normal and symmetric distal pulses are present in the upper extremities. No murmurs or rubs  Respiratory: Normal respiratory effort without tachypnea nor retractions. Breath sounds are clear and equal bilaterally.  Gastrointestinal: Soft and non-tender in all quadrants. No distention. There is no CVA tenderness. Genitourinary: deferred Musculoskeletal: Nontender with normal range of motion in all extremities. No lower extremity tenderness nor edema. 2+ pulses in the affected extremity, normal strength, grossly normal sensation. Symptoms are worsened by traction on the affected extremity Neurologic:  Normal speech and language. No gross focal neurologic deficits are  appreciated. Skin:  Skin is warm, dry and intact. No rash noted. Psychiatric: Mood and affect are normal. Patient exhibits appropriate insight and judgment.  ____________________________________________    LABS (pertinent positives/negatives)  Labs Reviewed  CBC  COMPREHENSIVE METABOLIC PANEL  TROPONIN I    ____________________________________________   EKG  ED ECG REPORT I, Lavonia Drafts, the attending physician, personally viewed and interpreted this ECG.  Date: 04/16/2016 EKG Time: 2:42 PM Rate: 75 Rhythm: normal sinus rhythm QRS Axis: normal Intervals: normal ST/T Wave abnormalities: normal Conduction Disturbances: none Narrative Interpretation: unremarkable   ____________________________________________    RADIOLOGY  None  ____________________________________________   PROCEDURES  Procedure(s) performed: none  Critical Care performed: none  ____________________________________________   INITIAL IMPRESSION / ASSESSMENT AND PLAN / ED COURSE  Pertinent labs & imaging results that were available during my care of the patient were reviewed by me and considered in my medical decision making (see chart for details).  Patient resents with right-sided arm pain, suspicious for radiculopathy. Given that she has had mild chest discomfort we will check labs, EKG and reevaluate.  Lab work and EKG is unremarkable. We will treat with NSAIDs and have her follow-up with PCP. Return precautions discussed.  ____________________________________________   FINAL CLINICAL IMPRESSION(S) / ED DIAGNOSES  Final diagnoses:  Radiculopathy of arm          Lavonia Drafts, MD 04/16/16 1909

## 2016-04-16 NOTE — ED Notes (Signed)
Pt states she was at work and started having sharp shooting pain down right arm. Pt also complains of pain to right side of chest. Pt denies SOB.

## 2016-08-24 ENCOUNTER — Encounter: Payer: Self-pay | Admitting: Medical Oncology

## 2016-08-24 ENCOUNTER — Emergency Department
Admission: EM | Admit: 2016-08-24 | Discharge: 2016-08-24 | Disposition: A | Discharge status: Home / Self Care | Payer: Self-pay | Attending: Emergency Medicine | Admitting: Emergency Medicine

## 2016-08-24 DIAGNOSIS — J0100 Acute maxillary sinusitis, unspecified: Secondary | ICD-10-CM | POA: Insufficient documentation

## 2016-08-24 DIAGNOSIS — F17210 Nicotine dependence, cigarettes, uncomplicated: Secondary | ICD-10-CM | POA: Insufficient documentation

## 2016-08-24 DIAGNOSIS — J320 Chronic maxillary sinusitis: Secondary | ICD-10-CM

## 2016-08-24 DIAGNOSIS — Z79899 Other long term (current) drug therapy: Secondary | ICD-10-CM | POA: Insufficient documentation

## 2016-08-24 DIAGNOSIS — Z791 Long term (current) use of non-steroidal anti-inflammatories (NSAID): Secondary | ICD-10-CM | POA: Insufficient documentation

## 2016-08-24 LAB — POCT RAPID STREP A: Streptococcus, Group A Screen (Direct): NEGATIVE

## 2016-08-24 MED ORDER — FLUTICASONE PROPIONATE 50 MCG/ACT NA SUSP: 2.0000 | Freq: Every day | NASAL | 0 refills | Status: DC

## 2016-08-24 MED ORDER — AMOXICILLIN 875 MG PO TABS: 875.0000 mg | ORAL_TABLET | Freq: Two times a day (BID) | ORAL | 0 refills | Status: DC

## 2016-08-24 NOTE — ED Triage Notes (Signed)
Pt reports 2 days of sore throat, fever chills and cough.

## 2016-08-24 NOTE — ED Provider Notes (Signed)
[facility] Emergency Department Provider Note  ____________________________________________  Time seen: Approximately 9:18 AM  I have reviewed the triage vital signs and the nursing notes.   HISTORY  Chief Complaint Fever; Sore Throat; and Chills    HPI Paige White is a 25 y.o. female , NAD, presents to the emergency with 2 day history of sore throat, subjective fever and chills. Notes that she is exposed to her boyfriend who is been sick with similar symptoms but no specific diagnosis. Has had mild nasal congestion and runny nose with right-sided sinus pressure. Denies any ear pain, drainage from the ears. No sneezing or history of allergies or asthma. Denies any swelling about the tongue/throat. No difficulty swallowing or breathing. Denies chest pain, shortness breath, wheezing. Has had a mild dry cough but denies chest congestion. Patient denies any abdominal pain, nausea, vomiting. Has not noted any rashes. Has been taking over-the-counter cough syrup and throat lozenges without any relief of symptoms.   Past Medical History:  Diagnosis Date  . Herpes     There are no active problems to display for this patient.   History reviewed. No pertinent surgical history.  Prior to Admission medications   Medication Sig Start Date End Date Taking? Authorizing Provider  amoxicillin (AMOXIL) 875 MG tablet Take 1 tablet (875 mg total) by mouth 2 (two) times daily. 08/24/16   Jami L Hagler, PA-C  butalbital-acetaminophen-caffeine (FIORICET) 50-325-40 MG tablet Take 1-2 tablets by mouth every 6 (six) hours as needed for headache. 10/14/15   Pierce Crane Beers, PA-C  fluticasone (FLONASE) 50 MCG/ACT nasal spray Place 2 sprays into both nostrils daily. 08/24/16   Jami L Hagler, PA-C  ranitidine (ZANTAC) 150 MG tablet Take 1 tablet (150 mg total) by mouth 2 (two) times daily. 03/09/16 03/09/17  Johnn Hai, PA-C    Allergies Review of patient's allergies indicates no  known allergies.  No family history on file.  Social History Social History  Substance Use Topics  . Smoking status: Current Every Day Smoker    Packs/day: 0.50    Types: Cigarettes  . Smokeless tobacco: Not on file  . Alcohol use Yes     Review of Systems  Constitutional: Positive subjective fevers, chills but no fatigue. Eyes: No visual changes. No discharge, redness, pain ENT: Positive nasal congestion, runny nose, sinus pressure, sore throat. Cardiovascular: No chest pain. Respiratory: Positive dry nonproductive cough without chest congestion. No shortness of breath. No wheezing.  Gastrointestinal: No abdominal pain.  No nausea, vomiting.  Musculoskeletal: Negative for pain, general myalgias.  Skin: Negative for rash. Neurological: Negative for headaches, focal weakness or numbness. 10-point ROS otherwise negative.  ____________________________________________   PHYSICAL EXAM:  VITAL SIGNS: ED Triage Vitals  Enc Vitals Group     BP 08/24/16 0833 (!) 148/85     Pulse Rate 08/24/16 0833 91     Resp 08/24/16 0833 17     Temp 08/24/16 0833 99.8 F (37.7 C)     Temp Source 08/24/16 0833 Oral     SpO2 08/24/16 0833 100 %     Weight 08/24/16 0832 217 lb (98.4 kg)     Height 08/24/16 0832 5\' 3"  (1.6 m)     Head Circumference --      Peak Flow --      Pain Score 08/24/16 0833 10     Pain Loc --      Pain Edu? --      Excl. in [place]? --  Constitutional: Alert and oriented. Well appearing and in no acute distress. Eyes: Conjunctivae are normal without icterus or injection Head: Atraumatic. ENT:      Ears: Right TM visualized with moderate bulging, mild serous effusion and mild erythema without perforation. Left TM visualized with mild serous effusion but no bulging, erythema, perforation.      Nose: No congestion/rhinnorhea.Turbinates are injected and mildly edematous.      Mouth/Throat: Mucous membranes are moist. Pharynx with trace erythema, but no swelling,  exudate. Uvula is midline. Airways patent. White postnasal drip. Neck: Supple with full range of motion. Hematological/Lymphatic/Immunilogical: No cervical lymphadenopathy. Cardiovascular: Normal rate, regular rhythm. Normal S1 and S2.  Good peripheral circulation. Respiratory: Normal respiratory effort without tachypnea or retractions. Lungs CTAB with breath sounds noted in all lung fields. No wheeze, rhonchi, rales. Neurologic:  Normal speech and language. No gross focal neurologic deficits are appreciated.  Skin:  Skin is warm, dry and intact. No rash noted. Psychiatric: Mood and affect are normal. Speech and behavior are normal. Patient exhibits appropriate insight and judgement.   ____________________________________________   LABS (all labs ordered are listed, but only abnormal results are displayed)  Labs Reviewed  CULTURE, GROUP A STREP [facility])  POCT RAPID STREP A   ____________________________________________  EKG  None ____________________________________________  RADIOLOGY  None ____________________________________________    PROCEDURES  Procedure(s) performed: None   Procedures   Medications - No data to display   ____________________________________________   INITIAL IMPRESSION / ASSESSMENT AND PLAN / ED COURSE  Pertinent labs & imaging results that were available during my care of the patient were reviewed by me and considered in my medical decision making (see chart for details).  Clinical Course    Patient's diagnosis is consistent with Right maxillary sinusitis. Patient will be discharged home with prescriptions for amoxicillin and Flonase to take as directed. May continue over-the-counter Tylenol or ibuprofen as needed. Patient is to follow up with her primary care provider if symptoms persist past this treatment course. Patient is given ED precautions to return to the ED for any worsening or new symptoms.     ____________________________________________  FINAL CLINICAL IMPRESSION(S) / ED DIAGNOSES  Final diagnoses:  Right maxillary sinusitis      NEW MEDICATIONS STARTED DURING THIS VISIT:  Discharge Medication List as of 08/24/2016  9:30 AM    START taking these medications   Details  amoxicillin (AMOXIL) 875 MG tablet Take 1 tablet (875 mg total) by mouth 2 (two) times daily., Starting Tue 08/24/2016, Print    fluticasone (FLONASE) 50 MCG/ACT nasal spray Place 2 sprays into both nostrils daily., Starting Tue 08/24/2016, [place], PA-C 08/24/16 1014    Earleen Newport, MD 08/24/16 [phone]

## 2016-08-24 NOTE — ED Notes (Signed)
Sore throat for 3 days  Low grade fever  Increased pain with swallowing

## 2016-08-24 NOTE — ED Notes (Signed)
Pt alert and oriented X4, active, cooperative, pt in NAD. RR even and unlabored, color WNL.  Pt informed to return if any life threatening symptoms occur.   

## 2016-08-26 LAB — CULTURE, GROUP A STREP (THRC)

## 2017-01-08 ENCOUNTER — Emergency Department
Admission: EM | Admit: 2017-01-08 | Discharge: 2017-01-08 | Disposition: A | Discharge status: Home / Self Care | Payer: Medicaid Other | Attending: Emergency Medicine | Admitting: Emergency Medicine

## 2017-01-08 ENCOUNTER — Encounter: Payer: Self-pay | Admitting: Emergency Medicine

## 2017-01-08 DIAGNOSIS — Z79899 Other long term (current) drug therapy: Secondary | ICD-10-CM | POA: Insufficient documentation

## 2017-01-08 DIAGNOSIS — J111 Influenza due to unidentified influenza virus with other respiratory manifestations: Secondary | ICD-10-CM | POA: Insufficient documentation

## 2017-01-08 DIAGNOSIS — F17210 Nicotine dependence, cigarettes, uncomplicated: Secondary | ICD-10-CM | POA: Insufficient documentation

## 2017-01-08 LAB — POCT RAPID STREP A: Streptococcus, Group A Screen (Direct): NEGATIVE

## 2017-01-08 LAB — INFLUENZA PANEL BY PCR (TYPE A & B)
Influenza A By PCR: NEGATIVE
Influenza B By PCR: NEGATIVE

## 2017-01-08 MED ORDER — ACETAMINOPHEN 325 MG PO TABS
650.0000 mg | ORAL_TABLET | Freq: Once | ORAL | Status: AC | PRN
  Administered 2017-01-08: 650 mg via ORAL
  Filled 2017-01-08: qty 2

## 2017-01-08 MED ORDER — BENZONATATE 100 MG PO CAPS: 100.0000 mg | ORAL_CAPSULE | Freq: Three times a day (TID) | ORAL | 0 refills | Status: DC | PRN

## 2017-01-08 MED ORDER — FLUTICASONE PROPIONATE 50 MCG/ACT NA SUSP: 2.0000 | Freq: Every day | NASAL | 0 refills | Status: DC

## 2017-01-08 MED ORDER — ONDANSETRON 4 MG PO TBDP: 4.0000 mg | ORAL_TABLET | Freq: Four times a day (QID) | ORAL | 0 refills | Status: DC | PRN

## 2017-01-08 MED ORDER — ONDANSETRON 4 MG PO TBDP
4.0000 mg | ORAL_TABLET | Freq: Once | ORAL | Status: AC
  Administered 2017-01-08: 4 mg via ORAL
  Filled 2017-01-08: qty 1

## 2017-01-08 MED ORDER — DEXAMETHASONE SODIUM PHOSPHATE 10 MG/ML IJ SOLN
10.0000 mg | Freq: Once | INTRAMUSCULAR | Status: AC
  Administered 2017-01-08: 10 mg via INTRAMUSCULAR
  Filled 2017-01-08: qty 1

## 2017-01-08 NOTE — ED Triage Notes (Signed)
Patient with complaint of cough, sore throat, chills and body aches.

## 2017-01-08 NOTE — ED Notes (Signed)
Pt. Going home with friend 

## 2017-01-08 NOTE — Discharge Instructions (Signed)
Your symptoms are consistent with influenza, even though your flu test was negative today. Take the prescription meds as directed. Start OTC Delsym (dextromethorphan) cough syrup along with pseudoephedrine for sinus pressure relief. Consider taking a daily allergy medicine (Allegra, Claritin, Zyrtec) or Benadryl for runny nose symptoms. Continue to hydrate and take the nausea medicine as needed. Return to the ED as needed.

## 2017-01-08 NOTE — ED Provider Notes (Signed)
[facility] Emergency Department Provider Note ____________________________________________  Time seen: 2012  I have reviewed the triage vital signs and the nursing notes.  HISTORY  Chief Complaint  Cough; Chills; Generalized Body Aches; and Sore Throat  HPI Paige White is a 26 y.o. female presents to the ED for evaluation of sudden onset of flu-like symptoms this morning. She admits she did not receive the influenza vaccine. She denies any sick contacts, recent travel or bad food exposures. She reports cough, cough-induced vomiting, nasal congestion, and sore throat. She has been using cough drops for limited benefit. She denies any frank fevers, chills, or sweats.   Past Medical History:  Diagnosis Date  . Herpes     There are no active problems to display for this patient.   History reviewed. No pertinent surgical history.  Prior to Admission medications   Medication Sig Start Date End Date Taking? Authorizing Provider  amoxicillin (AMOXIL) 875 MG tablet Take 1 tablet (875 mg total) by mouth 2 (two) times daily. 08/24/16   Jami L Hagler, PA-C  benzonatate (TESSALON PERLES) 100 MG capsule Take 1 capsule (100 mg total) by mouth 3 (three) times daily as needed for cough (Take 1-2 per dose). 01/08/17   Jessica Seidman V Bacon Matthe Sloane, PA-C  butalbital-acetaminophen-caffeine (FIORICET) 50-325-40 MG tablet Take 1-2 tablets by mouth every 6 (six) hours as needed for headache. 10/14/15   Pierce Crane Beers, PA-C  fluticasone (FLONASE) 50 MCG/ACT nasal spray Place 2 sprays into both nostrils daily. 01/08/17   Zakkary Thibault V Bacon Milianna Ericsson, PA-C  ondansetron (ZOFRAN ODT) 4 MG disintegrating tablet Take 1 tablet (4 mg total) by mouth every 6 (six) hours as needed for nausea or vomiting. 01/08/17   Dannielle Karvonen Esa Raden, PA-C  ranitidine (ZANTAC) 150 MG tablet Take 1 tablet (150 mg total) by mouth 2 (two) times daily. 03/09/16 03/09/17  Johnn Hai, PA-C    Allergies Patient has no  known allergies.  No family history on file.  Social History Social History  Substance Use Topics  . Smoking status: Current Every Day Smoker    Packs/day: 0.50    Types: Cigarettes  . Smokeless tobacco: Never Used  . Alcohol use Yes    Review of Systems  Constitutional: Negative for fever. Eyes: Negative for visual changes. ENT: Positive for sore throat, runny nose and congestion Cardiovascular: Negative for chest pain. Respiratory: Negative for shortness of breath. Gastrointestinal: Negative for abdominal pain, and diarrhea. Reports cough-induced vomiting.  Musculoskeletal: Negative for back pain. General bodyaches. Skin: Negative for rash. Neurological: Negative for headaches, focal weakness or numbness. ____________________________________________  PHYSICAL EXAM:  VITAL SIGNS: ED Triage Vitals  Enc Vitals Group     BP 01/08/17 2043 130/82     Pulse Rate 01/08/17 2043 (!) 118     Resp 01/08/17 2043 18     Temp 01/08/17 2043 (!) 101.6 F (38.7 C)     Temp src --      SpO2 01/08/17 2043 98 %     Weight 01/08/17 2044 217 lb (98.4 kg)     Height 01/08/17 2044 5\' 3"  (1.6 m)     Head Circumference --      Peak Flow --      Pain Score 01/08/17 2045 6     Pain Loc --      Pain Edu? --      Excl. in [place]? --     Constitutional: Alert and oriented. Well appearing and in no distress.  Head: Normocephalic and atraumatic. Eyes: Conjunctivae are normal. PERRL. Normal extraocular movements Ears: Canals clear. TMs intact bilaterally. Nose: No congestion/rhinorrhea/epistaxis. Mouth/Throat: Mucous membranes are moist. Uvula is midline and tonsils are flat without erythema, exudate, or lesions. Neck: Supple. No thyromegaly. Hematological/Lymphatic/Immunological: No cervical lymphadenopathy. Cardiovascular: Normal rate, regular rhythm. Normal distal pulses. Respiratory: Normal respiratory effort. No wheezes/rales/rhonchi. Gastrointestinal: Soft and nontender. No  distention. Musculoskeletal: Nontender with normal range of motion in all extremities.  Neurologic:  Normal gait without ataxia. Normal speech and language. No gross focal neurologic deficits are appreciated. Skin:  Skin is warm, dry and intact. No rash noted. ____________________________________________   LABS (pertinent positives/negatives)  Labs Reviewed  INFLUENZA PANEL BY PCR (TYPE A & B)  POCT RAPID STREP A  ____________________________________________  PROCEDURES  Tylenol 650 mg PO Decadron 10 mg IM Zofran 4 mg ODT ____________________________________________  INITIAL IMPRESSION / ASSESSMENT AND PLAN / ED COURSE  Patient with symptoms consistent with an acute influenza infection despite a negative laboratory tests. She'll be discharged with prescriptions for Tessalon Perles, Flonase, and Zofran 2 doses directed. She should dose over-the-counter allergy medicine, cough medicine, and decongestant as directed. She will follow with her primary care provider at [facility] clinic for ongoing or worsening symptoms. Return to the ED as necessary. ____________________________________________  FINAL CLINICAL IMPRESSION(S) / ED DIAGNOSES  Final diagnoses:  Influenza      Melvenia Needles, PA-C 01/08/17 2206    Harvest Dark, MD 01/09/17 2230

## 2017-05-02 ENCOUNTER — Emergency Department
Admission: EM | Admit: 2017-05-02 | Discharge: 2017-05-02 | Disposition: A | Discharge status: Home / Self Care | Payer: Self-pay | Attending: Emergency Medicine | Admitting: Emergency Medicine

## 2017-05-02 ENCOUNTER — Emergency Department: Payer: Self-pay

## 2017-05-02 ENCOUNTER — Telehealth: Payer: Self-pay | Admitting: Emergency Medicine

## 2017-05-02 DIAGNOSIS — M25572 Pain in left ankle and joints of left foot: Secondary | ICD-10-CM | POA: Insufficient documentation
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
## 2018-09-07 ENCOUNTER — Emergency Department
Admission: EM | Admit: 2018-09-07 | Discharge: 2018-09-07 | Disposition: A | Discharge status: Home / Self Care | Payer: Self-pay | Attending: Emergency Medicine | Admitting: Emergency Medicine

## 2018-09-07 ENCOUNTER — Emergency Department: Payer: Self-pay

## 2018-09-07 ENCOUNTER — Encounter: Payer: Self-pay | Admitting: Emergency Medicine

## 2018-09-07 ENCOUNTER — Other Ambulatory Visit: Payer: Self-pay

## 2018-09-07 DIAGNOSIS — Z79899 Other long term (current) drug therapy: Secondary | ICD-10-CM | POA: Insufficient documentation

## 2018-09-07 DIAGNOSIS — X500XXA Overexertion from strenuous movement or load, initial encounter: Secondary | ICD-10-CM | POA: Insufficient documentation

## 2018-09-07 DIAGNOSIS — S29011A Strain of muscle and tendon of front wall of thorax, initial encounter: Secondary | ICD-10-CM | POA: Insufficient documentation

## 2018-09-07 DIAGNOSIS — F17210 Nicotine dependence, cigarettes, uncomplicated: Secondary | ICD-10-CM | POA: Insufficient documentation

## 2018-09-07 DIAGNOSIS — Y990 Civilian activity done for income or pay: Secondary | ICD-10-CM | POA: Insufficient documentation

## 2018-09-07 DIAGNOSIS — Y9389 Activity, other specified: Secondary | ICD-10-CM | POA: Insufficient documentation

## 2018-09-07 DIAGNOSIS — Y929 Unspecified place or not applicable: Secondary | ICD-10-CM | POA: Insufficient documentation

## 2018-09-07 LAB — FIBRIN DERIVATIVES D-DIMER (ARMC ONLY): Fibrin derivatives D-dimer (ARMC): 299.39 ng/mL (FEU) (ref 0.00–499.00)

## 2018-09-07 MED ORDER — METHOCARBAMOL 500 MG PO TABS
1000.0000 mg | ORAL_TABLET | Freq: Once | ORAL | Status: AC
  Administered 2018-09-07: 1000 mg via ORAL
  Filled 2018-09-07: qty 2

## 2018-09-07 MED ORDER — KETOROLAC TROMETHAMINE 10 MG PO TABS: 10.0000 mg | ORAL_TABLET | Freq: Four times a day (QID) | ORAL | 0 refills | Status: AC | PRN

## 2018-09-07 MED ORDER — KETOROLAC TROMETHAMINE 30 MG/ML IJ SOLN
30.0000 mg | Freq: Once | INTRAMUSCULAR | Status: AC
Start: 2018-09-07 — End: 2018-09-07
  Administered 2018-09-07: 30 mg via INTRAMUSCULAR
  Filled 2018-09-07: qty 1

## 2018-09-07 MED ORDER — METHOCARBAMOL 500 MG PO TABS: 500.0000 mg | ORAL_TABLET | Freq: Three times a day (TID) | ORAL | 0 refills | Status: AC | PRN

## 2018-09-07 NOTE — ED Triage Notes (Signed)
Pt to ED via POV, pt states that for the last 2 weeks she has been having upper back pain and right should pain. Pt states that she feels like something is pulling. Pt states that she is unable to sleep on that side during the night. Pt is in NAD at this time.
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
## 2018-09-07 NOTE — ED Notes (Signed)
See triage note  Presents with a 2 week hx of upper back pain and shoulder pain   No fever or specific injury

## 2019-08-13 ENCOUNTER — Other Ambulatory Visit: Payer: Self-pay

## 2019-08-14 ENCOUNTER — Ambulatory Visit
Admission: RE | Admit: 2019-08-14 | Discharge: 2019-08-14 | Disposition: A | Discharge status: Home / Self Care | Payer: Medicaid Other | Source: Ambulatory Visit | Attending: Oncology | Admitting: Oncology

## 2019-08-14 ENCOUNTER — Other Ambulatory Visit: Payer: Self-pay

## 2019-08-14 ENCOUNTER — Ambulatory Visit: Payer: Self-pay | Attending: Oncology

## 2019-08-14 VITALS — BP 112/81 | HR 62 | Temp 97.8°F | Ht 64.5 in | Wt 206.6 lb

## 2019-08-14 DIAGNOSIS — N63 Unspecified lump in unspecified breast: Secondary | ICD-10-CM

## 2019-08-14 NOTE — Progress Notes (Signed)
  Subjective:     Patient ID: Paige White, female   DOB: [birth year], 28 y.o.   MRN: [id number]  HPI   Review of Systems     Objective:   Physical Exam Chest:     Breasts:        Right: Mass and tenderness present. No swelling, bleeding, inverted nipple, nipple discharge or skin change.        Left: No swelling, bleeding, inverted nipple, mass, nipple discharge, skin change or tenderness.       Comments: Pea-sized tender nodule right breast adjacent to areola       Assessment:     28 year old patient presents for [facility] clinic visit. Complains of tender right breast nodule x 2 weeks.  Paternal grandmother and great -grand mother with history of breast cancer.  Harriett Rush lifetime risk 2626.5%.  Patient screened, and meets BCCCP eligibility.  Patient does not have insurance, Medicare or Medicaid. Instructed patient on breast self awareness using teach back method.  Clinical Breast exam reveals a pea sized nodule lower inner quadrant right breast, adjacent to areola.  Glandular nodularity bilateral inner areola but more prominent nodule on right.      Plan:     Sent for diagnostic mammogram, and ultrasound.

## 2019-08-30 ENCOUNTER — Ambulatory Visit: Payer: Medicaid Other

## 2019-08-30 ENCOUNTER — Other Ambulatory Visit: Payer: Medicaid Other
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
## 2019-11-30 ENCOUNTER — Other Ambulatory Visit: Payer: Self-pay

## 2019-11-30 ENCOUNTER — Emergency Department
Admission: EM | Admit: 2019-11-30 | Discharge: 2019-11-30 | Disposition: A | Discharge status: Home / Self Care | Payer: Self-pay | Attending: Emergency Medicine | Admitting: Emergency Medicine

## 2019-11-30 ENCOUNTER — Emergency Department: Payer: Self-pay

## 2019-11-30 ENCOUNTER — Encounter: Payer: Self-pay | Admitting: Emergency Medicine

## 2019-11-30 DIAGNOSIS — Z79899 Other long term (current) drug therapy: Secondary | ICD-10-CM | POA: Insufficient documentation

## 2019-11-30 DIAGNOSIS — Z20822 Contact with and (suspected) exposure to covid-19: Secondary | ICD-10-CM

## 2019-11-30 DIAGNOSIS — U071 COVID-19: Secondary | ICD-10-CM | POA: Insufficient documentation

## 2019-11-30 DIAGNOSIS — F17210 Nicotine dependence, cigarettes, uncomplicated: Secondary | ICD-10-CM | POA: Insufficient documentation

## 2019-11-30 LAB — COMPREHENSIVE METABOLIC PANEL
ALT: 17 U/L (ref 0–44)
AST: 19 U/L (ref 15–41)
Albumin: 3.6 g/dL (ref 3.5–5.0)
Alkaline Phosphatase: 35 U/L — ABNORMAL LOW (ref 38–126)
Anion gap: 8 (ref 5–15)
BUN: 18 mg/dL (ref 6–20)
CO2: 26 mmol/L (ref 22–32)
Calcium: 8.9 mg/dL (ref 8.9–10.3)
Chloride: 103 mmol/L (ref 98–111)
Creatinine, Ser: 1.23 mg/dL — ABNORMAL HIGH (ref 0.44–1.00)
GFR calc Af Amer: >60 mL/min (ref >60)
GFR calc non Af Amer: >60 mL/min (ref >60)
Glucose, Bld: 100 mg/dL — ABNORMAL HIGH (ref 70–99)
Potassium: 3.9 mmol/L (ref 3.5–5.1)
Sodium: 137 mmol/L (ref 135–145)
Total Bilirubin: 0.5 mg/dL (ref 0.3–1.2)
Total Protein: 7.3 g/dL (ref 6.5–8.1)

## 2019-11-30 LAB — CBC WITH DIFFERENTIAL/PLATELET
Abs Immature Granulocytes: 0.02 10*3/uL (ref 0.00–0.07)
Basophils Absolute: 0 10*3/uL (ref 0.0–0.1)
Basophils Relative: 1 %
Eosinophils Absolute: 0.2 10*3/uL (ref 0.0–0.5)
Eosinophils Relative: 3 %
HCT: 40.1 % (ref 36.0–46.0)
Hemoglobin: 13 g/dL (ref 12.0–15.0)
Immature Granulocytes: 0 %
Lymphocytes Relative: 22 %
Lymphs Abs: 1.1 10*3/uL (ref 0.7–4.0)
MCH: 28.1 pg (ref 26.0–34.0)
MCHC: 32.4 g/dL (ref 30.0–36.0)
MCV: 86.8 fL (ref 80.0–100.0)
Monocytes Absolute: 0.6 10*3/uL (ref 0.1–1.0)
Monocytes Relative: 13 %
Neutro Abs: 3 10*3/uL (ref 1.7–7.7)
Neutrophils Relative %: 61 %
Platelets: 245 10*3/uL (ref 150–400)
RBC: 4.62 MIL/uL (ref 3.87–5.11)
RDW: 12.1 % (ref 11.5–15.5)
WBC: 4.9 10*3/uL (ref 4.0–10.5)
nRBC: 0 % (ref 0.0–0.2)

## 2019-11-30 LAB — TROPONIN I (HIGH SENSITIVITY): Troponin I (High Sensitivity): 3 ng/L (ref <18)

## 2019-11-30 LAB — SARS CORONAVIRUS 2 (TAT 6-24 HRS): SARS Coronavirus 2: POSITIVE — AB

## 2019-11-30 LAB — FIBRIN DERIVATIVES D-DIMER (ARMC ONLY): Fibrin derivatives D-dimer (ARMC): 317.04 ng/mL (FEU) (ref 0.00–499.00)

## 2019-11-30 MED ORDER — SODIUM CHLORIDE 0.9 % IV BOLUS
1000.0000 mL | Freq: Once | INTRAVENOUS | Status: AC
  Administered 2019-11-30: 1000 mL via INTRAVENOUS

## 2019-11-30 MED ORDER — KETOROLAC TROMETHAMINE 30 MG/ML IJ SOLN
15.0000 mg | Freq: Once | INTRAMUSCULAR | Status: AC
  Administered 2019-11-30: 05:00:00 15 mg via INTRAVENOUS
  Filled 2019-11-30: qty 1

## 2019-11-30 NOTE — ED Provider Notes (Signed)
[facility] Emergency Department Provider Note  ____________________________________________  Time seen: Approximately 5:35 AM  I have reviewed the triage vital signs and the nursing notes.   HISTORY  Chief Complaint Headache and Fatigue   HPI Paige White is a 28 y.o. female no significant past medical history  who presents for evaluation of Covid-like symptoms.  Patient reports positive exposure from a coworker at [employer].  She now has had 24 hours of body aches, chills, subjective fever, sore throat, headache, chest tightness, shortness of breath, and dry cough.  Her symptoms are mild but patient is concerned that she is Covid positive.  She denies any personal or family history of blood clots, recent travel immobilization, leg pain or swelling, hemoptysis, exogenous hormones.  No history of asthma.  She is a smoker.  Past Medical History:  Diagnosis Date  . Herpes     There are no problems to display for this patient.   History reviewed. No pertinent surgical history.  Prior to Admission medications   Medication Sig Start Date End Date Taking? Authorizing Provider  amoxicillin (AMOXIL) 875 MG tablet Take 1 tablet (875 mg total) by mouth 2 (two) times daily. 08/24/16   Hagler, Jami L, PA-C  benzonatate (TESSALON PERLES) 100 MG capsule Take 1 capsule (100 mg total) by mouth 3 (three) times daily as needed for cough (Take 1-2 per dose). 01/08/17   Menshew, Dannielle Karvonen, PA-C  butalbital-acetaminophen-caffeine (FIORICET) 50-325-40 MG tablet Take 1-2 tablets by mouth every 6 (six) hours as needed for headache. 10/14/15   Beers, Pierce Crane, PA-C  fluticasone (FLONASE) 50 MCG/ACT nasal spray Place 2 sprays into both nostrils daily. 01/08/17   Menshew, Dannielle Karvonen, PA-C  ondansetron (ZOFRAN ODT) 4 MG disintegrating tablet Take 1 tablet (4 mg total) by mouth every 6 (six) hours as needed for nausea or vomiting. 01/08/17   Menshew, Dannielle Karvonen, PA-C   ranitidine (ZANTAC) 150 MG tablet Take 1 tablet (150 mg total) by mouth 2 (two) times daily. 03/09/16 03/09/17  Johnn Hai, PA-C    Allergies Patient has no known allergies.  Family History  Problem Relation Age of Onset  . Breast cancer Paternal Grandmother   . Breast cancer Other     Social History Social History   Tobacco Use  . Smoking status: Current Every Day Smoker    Packs/day: 0.50    Types: Cigarettes  . Smokeless tobacco: Never Used  Substance Use Topics  . Alcohol use: Yes  . Drug use: Yes    Types: Marijuana    Comment: last use yesterday    Review of Systems  Constitutional: +fever, body aches Eyes: Negative for visual changes. ENT: + sore throat. Neck: No neck pain  Cardiovascular: + chest tightness Respiratory: + shortness of breath, cough Gastrointestinal: Negative for abdominal pain, vomiting or diarrhea. Genitourinary: Negative for dysuria. Musculoskeletal: Negative for back pain. Skin: Negative for rash. Neurological: Negative for headaches, weakness or numbness. Psych: No SI or HI  ____________________________________________   PHYSICAL EXAM:  VITAL SIGNS: ED Triage Vitals  Enc Vitals Group     BP 11/30/19 0410 131/77     Pulse Rate 11/30/19 0410 83     Resp 11/30/19 0410 18     Temp 11/30/19 0410 99.1 F (37.3 C)     Temp Source 11/30/19 0529 Oral     SpO2 11/30/19 0410 100 %     Weight 11/30/19 0529 215 lb (97.5 kg)  Height 11/30/19 0529 5\' 3"  (1.6 m)     Head Circumference --      Peak Flow --      Pain Score 11/30/19 0512 7     Pain Loc --      Pain Edu? --      Excl. in [place]? --     Constitutional: Alert and oriented. Well appearing and in no apparent distress. HEENT:      Head: Normocephalic and atraumatic.         Eyes: Conjunctivae are normal. Sclera is non-icteric.       Mouth/Throat: Mucous membranes are moist.       Neck: Supple with no signs of meningismus. Cardiovascular: Regular rate and rhythm. No  murmurs, gallops, or rubs. 2+ symmetrical distal pulses are present in all extremities. No JVD. Respiratory: Normal respiratory effort. Lungs are clear to auscultation bilaterally. No wheezes, crackles, or rhonchi.  Gastrointestinal: Soft, non tender, and non distended with positive bowel sounds. No rebound or guarding. Genitourinary: No CVA tenderness. Musculoskeletal: Nontender with normal range of motion in all extremities. No edema, cyanosis, or erythema of extremities. Neurologic: Normal speech and language. Face is symmetric. Moving all extremities. No gross focal neurologic deficits are appreciated. Skin: Skin is warm, dry and intact. No rash noted. Psychiatric: Mood and affect are normal. Speech and behavior are normal.  ____________________________________________   LABS (all labs ordered are listed, but only abnormal results are displayed)  Labs Reviewed  COMPREHENSIVE METABOLIC PANEL - Abnormal; Notable for the following components:      Result Value   Glucose, Bld 100 (*)    Creatinine, Ser 1.23 (*)    Alkaline Phosphatase 35 (*)    All other components within normal limits  SARS CORONAVIRUS 2 (TAT 6-24 HRS)  CBC WITH DIFFERENTIAL/PLATELET  FIBRIN DERIVATIVES D-DIMER ([facility] ONLY)  TROPONIN I (HIGH SENSITIVITY)   ____________________________________________  EKG  ED ECG REPORT I, Rudene Re, the attending physician, personally viewed and interpreted this ECG.  Normal sinus rhythm, rate of 82, normal intervals, normal axis, no ST elevations or depressions, T wave inversion in inferior leads which is new when compared to prior. ____________________________________________  RADIOLOGY  I have personally reviewed the images performed during this visit and I agree with the Radiologist's read.   Interpretation by Radiologist:  DG Chest Portable 1 View  Result Date: 11/30/2019 CLINICAL DATA:  Headache, sore throat, generalized malaise. COVID-19 exposure. EXAM:  PORTABLE CHEST 1 VIEW COMPARISON:  09/07/2018 FINDINGS: The cardiomediastinal contours are normal. The lungs are clear. Pulmonary vasculature is normal. No consolidation, pleural effusion, or pneumothorax. No acute osseous abnormalities are seen. IMPRESSION: Negative AP view of the chest. Electronically Signed   By: Keith Rake M.D.   On: 11/30/2019 05:23     ____________________________________________   PROCEDURES  Procedure(s) performed: None Procedures Critical Care performed:  None ____________________________________________   INITIAL IMPRESSION / ASSESSMENT AND PLAN / ED COURSE   28 y.o. female no significant past medical history  who presents for evaluation of Covid-like symptoms x 24 hours.  She is extremely well-appearing in no distress with normal vital signs, satting 100% with at rest and with ambulation, lungs are clear to auscultation.  EKG and troponin with no signs of myocarditis.  Chest x-ray with no infiltrate.  D-dimer labs are pending.  Covid swab was sent.  Will give Toradol and fluids for symptom relief and reassess for disposition.    _________________________ 6:15 AM on 11/30/2019 -----------------------------------------  Mild bump on  the creatinine with normal GFR.  Patient received a liter of fluid.  Most likely dehydration.  D-dimer negative.  Labs are otherwise within normal limits.  Discussed quarantine with patient until the test result is back and quarantine if test is positive.  Discussed my standard return precautions.  Discussed pulse ox monitoring at home.  As part of my medical decision making, I reviewed the following data within the [place] notes reviewed and incorporated, Labs reviewed , EKG interpreted , Old chart reviewed, Radiograph reviewed , Notes from prior ED visits and [place] Controlled Substance Database   Please note:  Patient was evaluated in Emergency Department today for the symptoms described in the history  of present illness. Patient was evaluated in the context of the global COVID-19 pandemic, which necessitated consideration that the patient might be at risk for infection with the SARS-CoV-2 virus that causes COVID-19. Institutional protocols and algorithms that pertain to the evaluation of patients at risk for COVID-19 are in a state of rapid change based on information released by regulatory bodies including the CDC and federal and state organizations. These policies and algorithms were followed during the patient's care in the ED.  Some ED evaluations and interventions may be delayed as a result of limited staffing during the pandemic.   ____________________________________________   FINAL CLINICAL IMPRESSION(S) / ED DIAGNOSES   Final diagnoses:  Suspected COVID-19 virus infection      NEW MEDICATIONS STARTED DURING THIS VISIT:  ED Discharge Orders    None       Note:  This document was prepared using Dragon voice recognition software and may include unintentional dictation errors.    Rudene Re, MD 11/30/19 [phone]

## 2019-11-30 NOTE — ED Notes (Signed)
Patient returned call.  Informed of positive results.

## 2019-11-30 NOTE — ED Triage Notes (Signed)
Pt reports she was exposed to [place] and since 12/10 has had HA, sore throat and general malaise.

## 2019-11-30 NOTE — ED Notes (Signed)
COVID results positive.  Patient called, voice mail left for patient to call ED back.

## 2019-11-30 NOTE — Discharge Instructions (Signed)
QUARANTINE INSTRUCTION  Follow these instructions at home:  Protecting others To avoid spreading the illness to other people: Quarantine in your home until you have had no cough and fever for 7 days. Household members should also be quarantine for at least 14 days after being exposed to you. Wash your hands often with soap and water. If soap and water are not available, use an alcohol-based hand sanitizer. If you have not cleaned your hands, do not touch your face. Make sure that all people in your household wash their hands well and often. Cover your nose and mouth when you cough or sneeze. Throw away used tissues. Stay home if you have any cold-like or flu-like symptoms. General instructions Go to your local pharmacy and buy a pulse oximeter (this is a machine that measures your oxygen). Check your oxygen levels at least 3 times a day. If your oxygen level is 92% or less return to the emergency room immediately Take over-the-counter and prescription medicines only as told by your health care provider. If you need medication for fever take tylenol or ibuprofen Drink enough fluid to keep your urine pale yellow. Rest at home as directed by your health care provider. Do not give aspirin to a child with the flu, because of the association with Reye's syndrome. Do not use tobacco products, including cigarettes, chewing tobacco, and e-cigarettes. If you need help quitting, ask your health care provider. Keep all follow-up visits as told by your health care provider. This is important. How is this prevented? Avoid areas where an outbreak has been reported. Avoid large groups of people. Keep a safe distance from people who are coughing and sneezing. Do not touch your face if you have not cleaned your hands. When you are around people who are sick or might be sick, wear a mask to protect yourself. Contact a health care provider if: You have symptoms of SARS (cough, fever, chest pain, shortness of  breath) that are not getting better at home. You have a fever. If you have difficulty breathing go to your local ER or call 911

## 2019-11-30 NOTE — ED Notes (Signed)
AAOx3.  Skin warm and dry.  NAD 

## 2019-12-31 ENCOUNTER — Other Ambulatory Visit: Payer: Self-pay | Admitting: Family Medicine

## 2019-12-31 ENCOUNTER — Other Ambulatory Visit: Payer: Self-pay | Admitting: *Deleted

## 2019-12-31 DIAGNOSIS — N63 Unspecified lump in unspecified breast: Secondary | ICD-10-CM

## 2019-12-31 DIAGNOSIS — D241 Benign neoplasm of right breast: Secondary | ICD-10-CM

## 2020-01-01 ENCOUNTER — Ambulatory Visit
Admission: RE | Admit: 2020-01-01 | Discharge: 2020-01-01 | Disposition: A | Discharge status: Home / Self Care | Payer: Medicaid Other | Source: Ambulatory Visit | Attending: Oncology | Admitting: Oncology

## 2020-01-01 DIAGNOSIS — N63 Unspecified lump in unspecified breast: Secondary | ICD-10-CM | POA: Insufficient documentation

## 2020-01-10 ENCOUNTER — Other Ambulatory Visit: Payer: Self-pay | Admitting: *Deleted

## 2020-01-10 ENCOUNTER — Encounter: Payer: Self-pay | Admitting: *Deleted

## 2020-01-10 DIAGNOSIS — N63 Unspecified lump in unspecified breast: Secondary | ICD-10-CM

## 2020-01-10 NOTE — Progress Notes (Signed)
Letter mailed to inform patient of her next [facility] appointment and ultrasound on July 02, 2020 @ 8:30.

## 2020-06-02 ENCOUNTER — Other Ambulatory Visit: Payer: Self-pay

## 2020-06-02 ENCOUNTER — Encounter: Payer: Self-pay | Admitting: Emergency Medicine

## 2020-06-02 ENCOUNTER — Ambulatory Visit
Admission: EM | Admit: 2020-06-02 | Discharge: 2020-06-02 | Disposition: A | Discharge status: Home / Self Care | Payer: Medicaid Other | Attending: Family Medicine | Admitting: Family Medicine

## 2020-06-02 DIAGNOSIS — M7672 Peroneal tendinitis, left leg: Secondary | ICD-10-CM

## 2020-06-02 HISTORY — DX: Essential (primary) hypertension: I10

## 2020-06-02 MED ORDER — HYDROCODONE-ACETAMINOPHEN 5-325 MG PO TABS: ORAL_TABLET | ORAL | 0 refills | Status: DC

## 2020-06-02 NOTE — ED Provider Notes (Signed)
[facility]    CSN: [id number] Arrival date & time: 06/02/20  0945      History   Chief Complaint Chief Complaint  Patient presents with  . Ankle Pain    HPI Paige White is a 29 y.o. female.   29 yo female with a c/o left ankle pain for the past 3 days. Denies any fall or specific traumatic injury. States she woke up with some pain on Saturday and it seems to have gotten a little bit worse. Denies any fevers, chills, rash, numbness/tingling.      Past Medical History:  Diagnosis Date  . Herpes   . Hypertension     There are no problems to display for this patient.   History reviewed. No pertinent surgical history.  OB History    Gravida  0   Para  0   Term  0   Preterm  0   AB  0   Living  0     SAB  0   TAB  0   Ectopic  0   Multiple  0   Live Births               Home Medications    Prior to Admission medications   Medication Sig Start Date End Date Taking? Authorizing Provider  HYDROCHLOROTHIAZIDE PO Take 12.5 mg by mouth.   Yes [provider]  amoxicillin (AMOXIL) 875 MG tablet Take 1 tablet (875 mg total) by mouth 2 (two) times daily. 08/24/16   Hagler, Jami L, PA-C  benzonatate (TESSALON PERLES) 100 MG capsule Take 1 capsule (100 mg total) by mouth 3 (three) times daily as needed for cough (Take 1-2 per dose). 01/08/17   Menshew, Dannielle Karvonen, PA-C  butalbital-acetaminophen-caffeine (FIORICET) 50-325-40 MG tablet Take 1-2 tablets by mouth every 6 (six) hours as needed for headache. 10/14/15   Beers, Pierce Crane, PA-C  fluticasone (FLONASE) 50 MCG/ACT nasal spray Place 2 sprays into both nostrils daily. 01/08/17   Menshew, Dannielle Karvonen, PA-C  HYDROcodone-acetaminophen (NORCO/VICODIN) 5-325 MG tablet 1-2 tabs po qd prn 06/02/20   Norval Gable, MD  ondansetron (ZOFRAN ODT) 4 MG disintegrating tablet Take 1 tablet (4 mg total) by mouth every 6 (six) hours as needed for nausea or vomiting. 01/08/17   Menshew, Dannielle Karvonen, PA-C  ranitidine (ZANTAC) 150 MG tablet Take 1 tablet (150 mg total) by mouth 2 (two) times daily. 03/09/16 03/09/17  Johnn Hai, PA-C    Family History Family History  Problem Relation Age of Onset  . Breast cancer Paternal Grandmother   . Breast cancer Other     Social History Social History   Tobacco Use  . Smoking status: Current Every Day Smoker    Packs/day: 0.50    Types: Cigarettes  . Smokeless tobacco: Never Used  Substance Use Topics  . Alcohol use: Yes  . Drug use: Yes    Types: Marijuana     Allergies   Patient has no known allergies.   Review of Systems Review of Systems   Physical Exam Triage Vital Signs ED Triage Vitals  Enc Vitals Group     BP 06/02/20 1028 123/75     Pulse Rate 06/02/20 1028 65     Resp 06/02/20 1028 18     Temp 06/02/20 1028 98.2 F (36.8 C)     Temp Source 06/02/20 1028 Oral     SpO2 06/02/20 1028 100 %     Weight  06/02/20 1025 216 lb (98 kg)     Height 06/02/20 1025 5\' 3"  (1.6 m)     Head Circumference --      Peak Flow --      Pain Score 06/02/20 1025 10     Pain Loc --      Pain Edu? --      Excl. in [place]? --    No data found.  Updated Vital Signs BP 123/75 (BP Location: Right Arm)   Pulse 65   Temp 98.2 F (36.8 C) (Oral)   Resp 18   Ht 5\' 3"  (1.6 m)   Wt 98 kg   SpO2 100%   BMI 38.26 kg/m   Visual Acuity Right Eye Distance:   Left Eye Distance:   Bilateral Distance:    Right Eye Near:   Left Eye Near:    Bilateral Near:     Physical Exam Vitals and nursing note reviewed.  Constitutional:      General: She is not in acute distress.    Appearance: She is not toxic-appearing or diaphoretic.  Musculoskeletal:     Left foot: Normal range of motion and normal capillary refill. Tenderness (along the peroneal tendon trajectory) present. No swelling, deformity, bunion, Charcot foot, foot drop, prominent metatarsal heads, laceration or crepitus. Normal pulse.     Comments: Left foot  neurovascularly intact  Neurological:     Mental Status: She is alert.      UC Treatments / Results  Labs (all labs ordered are listed, but only abnormal results are displayed) Labs Reviewed - No data to display  EKG   Radiology No results found.  Procedures Procedures (including critical care time)  Medications Ordered in UC Medications - No data to display  Initial Impression / Assessment and Plan / UC Course  I have reviewed the triage vital signs and the nursing notes.  Pertinent labs & imaging results that were available during my care of the patient were reviewed by me and considered in my medical decision making (see chart for details).     Final Clinical Impressions(s) / UC Diagnoses   Final diagnoses:  Peroneal tendonitis, left    ED Prescriptions    Medication Sig Dispense Auth. Provider   HYDROcodone-acetaminophen (NORCO/VICODIN) 5-325 MG tablet 1-2 tabs po qd prn 6 tablet Norval Gable, MD      1. diagnosis reviewed with patient 2. rx as per orders above; reviewed possible side effects, interactions, risks and benefits  3. Recommend supportive treatment with rest, ice, elevation 4. Follow-up prn if symptoms worsen or don't improve  I have reviewed the PDMP during this encounter.   Norval Gable, MD 06/02/20 1123

## 2020-06-02 NOTE — ED Triage Notes (Signed)
Patient c/o left ankle pain that started Saturday morning when she woke up. She denies injury. Painful to walk.

## 2020-06-07 ENCOUNTER — Emergency Department
Admission: EM | Admit: 2020-06-07 | Discharge: 2020-06-07 | Disposition: A | Discharge status: Home / Self Care | Payer: Self-pay | Attending: Emergency Medicine | Admitting: Emergency Medicine

## 2020-06-07 ENCOUNTER — Other Ambulatory Visit: Payer: Self-pay

## 2020-06-07 ENCOUNTER — Encounter: Payer: Self-pay | Admitting: Intensive Care

## 2020-06-07 ENCOUNTER — Emergency Department: Payer: Self-pay

## 2020-06-07 DIAGNOSIS — Z87891 Personal history of nicotine dependence: Secondary | ICD-10-CM | POA: Insufficient documentation

## 2020-06-07 DIAGNOSIS — N921 Excessive and frequent menstruation with irregular cycle: Secondary | ICD-10-CM | POA: Insufficient documentation

## 2020-06-07 DIAGNOSIS — R102 Pelvic and perineal pain: Secondary | ICD-10-CM | POA: Insufficient documentation

## 2020-06-07 DIAGNOSIS — N939 Abnormal uterine and vaginal bleeding, unspecified: Secondary | ICD-10-CM

## 2020-06-07 DIAGNOSIS — I10 Essential (primary) hypertension: Secondary | ICD-10-CM | POA: Insufficient documentation

## 2020-06-07 LAB — COMPREHENSIVE METABOLIC PANEL
ALT: 18 U/L (ref 0–44)
AST: 22 U/L (ref 15–41)
Albumin: 4.2 g/dL (ref 3.5–5.0)
Alkaline Phosphatase: 40 U/L (ref 38–126)
Anion gap: 6 (ref 5–15)
BUN: 7 mg/dL (ref 6–20)
CO2: 29 mmol/L (ref 22–32)
Calcium: 9.2 mg/dL (ref 8.9–10.3)
Chloride: 102 mmol/L (ref 98–111)
Creatinine, Ser: 1.11 mg/dL — ABNORMAL HIGH (ref 0.44–1.00)
GFR calc Af Amer: >60 mL/min (ref >60)
GFR calc non Af Amer: >60 mL/min (ref >60)
Glucose, Bld: 91 mg/dL (ref 70–99)
Potassium: 3.4 mmol/L — ABNORMAL LOW (ref 3.5–5.1)
Sodium: 137 mmol/L (ref 135–145)
Total Bilirubin: 0.8 mg/dL (ref 0.3–1.2)
Total Protein: 8 g/dL (ref 6.5–8.1)

## 2020-06-07 LAB — URINALYSIS, COMPLETE (UACMP) WITH MICROSCOPIC
Bacteria, UA: NONE SEEN
RBC / HPF: >50 RBC/hpf — ABNORMAL HIGH (ref 0–5)
Specific Gravity, Urine: 1.019 (ref 1.005–1.030)

## 2020-06-07 LAB — CBC
HCT: 42.4 % (ref 36.0–46.0)
Hemoglobin: 14.4 g/dL (ref 12.0–15.0)
MCH: 28.5 pg (ref 26.0–34.0)
MCHC: 34 g/dL (ref 30.0–36.0)
MCV: 83.8 fL (ref 80.0–100.0)
Platelets: 336 10*3/uL (ref 150–400)
RBC: 5.06 MIL/uL (ref 3.87–5.11)
RDW: 12.5 % (ref 11.5–15.5)
WBC: 6.7 10*3/uL (ref 4.0–10.5)
nRBC: 0 % (ref 0.0–0.2)

## 2020-06-07 LAB — CHLAMYDIA/NGC RT PCR (ARMC ONLY)
Chlamydia Tr: NOT DETECTED
N gonorrhoeae: NOT DETECTED

## 2020-06-07 LAB — WET PREP, GENITAL
Clue Cells Wet Prep HPF POC: NONE SEEN
Sperm: NONE SEEN
Trich, Wet Prep: NONE SEEN
Yeast Wet Prep HPF POC: NONE SEEN

## 2020-06-07 LAB — PREGNANCY, URINE: Preg Test, Ur: NEGATIVE

## 2020-06-07 LAB — LIPASE, BLOOD: Lipase: 27 U/L (ref 11–51)

## 2020-06-07 NOTE — ED Provider Notes (Signed)
[facility] Emergency Department Provider Note  ____________________________________________  Time seen: Approximately 5:25 PM  I have reviewed the triage vital signs and the nursing notes.   HISTORY  Chief Complaint Oligomenorrhea    HPI Paige White is a 29 y.o. female who presents the emergency department complaining of vaginal bleeding, pelvic cramping.  Patient states that she has very irregular periods, last period was in May, 2 months ago.  Patient states that she started with heavy vaginal bleeding 4 days ago.  As symptoms had not improved she presents the emergency department for evaluation.  She denies any fevers or chills, nausea vomiting, diarrhea or constipation.  No dysuria, polyuria, hematuria.  Patient denies any vaginal discharge other than vaginal bleeding.  She states that the pain in her pelvis is a cramping sensation.         Past Medical History:  Diagnosis Date  . Herpes   . Hypertension     There are no problems to display for this patient.   History reviewed. No pertinent surgical history.  Prior to Admission medications   Medication Sig Start Date End Date Taking? Authorizing Provider  amoxicillin (AMOXIL) 875 MG tablet Take 1 tablet (875 mg total) by mouth 2 (two) times daily. 08/24/16   Hagler, Jami L, PA-C  benzonatate (TESSALON PERLES) 100 MG capsule Take 1 capsule (100 mg total) by mouth 3 (three) times daily as needed for cough (Take 1-2 per dose). 01/08/17   Menshew, Dannielle Karvonen, PA-C  butalbital-acetaminophen-caffeine (FIORICET) 50-325-40 MG tablet Take 1-2 tablets by mouth every 6 (six) hours as needed for headache. 10/14/15   Beers, Pierce Crane, PA-C  fluticasone (FLONASE) 50 MCG/ACT nasal spray Place 2 sprays into both nostrils daily. 01/08/17   Menshew, Dannielle Karvonen, PA-C  HYDROCHLOROTHIAZIDE PO Take 12.5 mg by mouth.    [provider]  HYDROcodone-acetaminophen (NORCO/VICODIN) 5-325 MG tablet 1-2 tabs po  qd prn 06/02/20   Norval Gable, MD  ondansetron (ZOFRAN ODT) 4 MG disintegrating tablet Take 1 tablet (4 mg total) by mouth every 6 (six) hours as needed for nausea or vomiting. 01/08/17   Menshew, Dannielle Karvonen, PA-C  ranitidine (ZANTAC) 150 MG tablet Take 1 tablet (150 mg total) by mouth 2 (two) times daily. 03/09/16 03/09/17  Johnn Hai, PA-C    Allergies Patient has no known allergies.  Family History  Problem Relation Age of Onset  . Breast cancer Paternal Grandmother   . Breast cancer Other     Social History Social History   Tobacco Use  . Smoking status: Former Smoker    Packs/day: 0.50    Types: Cigarettes  . Smokeless tobacco: Never Used  Substance Use Topics  . Alcohol use: Yes    Alcohol/week: 4.0 standard drinks    Types: 2 Cans of beer, 2 Shots of liquor per week  . Drug use: Yes    Types: Marijuana     Review of Systems  Constitutional: No fever/chills Eyes: No visual changes. No discharge ENT: No upper respiratory complaints. Cardiovascular: no chest pain. Respiratory: no cough. No SOB. Gastrointestinal: No abdominal pain.  No nausea, no vomiting.  No diarrhea.  No constipation. Genitourinary: Heavier than normal vaginal bleeding with pelvic cramping. Genitourinary: Negative for dysuria. No hematuria Musculoskeletal: Negative for musculoskeletal pain. Skin: Negative for rash, abrasions, lacerations, ecchymosis. Neurological: Negative for headaches, focal weakness or numbness. 10-point ROS otherwise negative.   ____________________________________________   PHYSICAL EXAM:  VITAL SIGNS: ED Triage Vitals  Enc Vitals Group     BP 06/07/20 1648 (!) 141/84     Pulse Rate 06/07/20 1648 74     Resp 06/07/20 1648 16     Temp 06/07/20 1648 98.8 F (37.1 C)     Temp Source 06/07/20 1648 Oral     SpO2 06/07/20 1648 100 %     Weight 06/07/20 1648 216 lb (98 kg)     Height 06/07/20 1648 5\' 3"  (1.6 m)     Head Circumference --      Peak Flow --       Pain Score 06/07/20 1700 9     Pain Loc --      Pain Edu? --      Excl. in [place]? --      Constitutional: Alert and oriented. Well appearing and in no acute distress. Eyes: Conjunctivae are normal. PERRL. EOMI. Head: Atraumatic. ENT:      Ears:       Nose: No congestion/rhinnorhea.      Mouth/Throat: Mucous membranes are moist.  Neck: No stridor.    Cardiovascular: Normal rate, regular rhythm. Normal S1 and S2.  Good peripheral circulation. Respiratory: Normal respiratory effort without tachypnea or retractions. Lungs CTAB. Good air entry to the bases with no decreased or absent breath sounds. Gastrointestinal: Bowel sounds 4 quadrants. Soft and nontender to palpation. No guarding or rigidity. No palpable masses. No distention. No CVA tenderness. Genitourinary:  Pelvic exam reveals no external lesions or chancres.  Blood in the vaginal vault.  No vaginal tears or lesions.  Cervix is unremarkable.  Bimanual exam reveals no cervical motion tenderness.  No palpable masses or lesions in the bilateral adnexa. Musculoskeletal: Full range of motion to all extremities. No gross deformities appreciated. Neurologic:  Normal speech and language. No gross focal neurologic deficits are appreciated.  Skin:  Skin is warm, dry and intact. No rash noted. Psychiatric: Mood and affect are normal. Speech and behavior are normal. Patient exhibits appropriate insight and judgement.  Pelvic exam chaperoned by female RN. ____________________________________________   LABS (all labs ordered are listed, but only abnormal results are displayed)  Labs Reviewed  WET PREP, GENITAL - Abnormal; Notable for the following components:      Result Value   WBC, Wet Prep HPF POC FEW (*)    All other components within normal limits  COMPREHENSIVE METABOLIC PANEL - Abnormal; Notable for the following components:   Potassium 3.4 (*)    Creatinine, Ser 1.11 (*)    All other components within normal limits   URINALYSIS, COMPLETE (UACMP) WITH MICROSCOPIC - Abnormal; Notable for the following components:   Color, Urine RED (*)    APPearance CLOUDY (*)    Glucose, UA   (*)    Value: TEST NOT REPORTED DUE TO COLOR INTERFERENCE OF URINE PIGMENT   Hgb urine dipstick   (*)    Value: TEST NOT REPORTED DUE TO COLOR INTERFERENCE OF URINE PIGMENT   Bilirubin Urine   (*)    Value: TEST NOT REPORTED DUE TO COLOR INTERFERENCE OF URINE PIGMENT   Ketones, ur   (*)    Value: TEST NOT REPORTED DUE TO COLOR INTERFERENCE OF URINE PIGMENT   Protein, ur   (*)    Value: TEST NOT REPORTED DUE TO COLOR INTERFERENCE OF URINE PIGMENT   Nitrite   (*)    Value: TEST NOT REPORTED DUE TO COLOR INTERFERENCE OF URINE PIGMENT   Leukocytes,Ua   (*)    Value: TEST  NOT REPORTED DUE TO COLOR INTERFERENCE OF URINE PIGMENT   RBC / HPF >50 (*)    All other components within normal limits  CHLAMYDIA/NGC RT PCR ([facility] ONLY)  LIPASE, BLOOD  CBC  PREGNANCY, URINE  POC URINE PREG, ED   ____________________________________________  EKG   ____________________________________________  RADIOLOGY I personally viewed and evaluated these images as part of my medical decision making, as well as reviewing the written report by the radiologist.  US PELVIC COMPLETE W TRANSVAGINAL AND TORSION R/O  Result Date: 06/07/2020 CLINICAL DATA:  Pelvic pain and vaginal bleeding. EXAM: TRANSABDOMINAL AND TRANSVAGINAL ULTRASOUND OF PELVIS DOPPLER ULTRASOUND OF OVARIES TECHNIQUE: Both transabdominal and transvaginal ultrasound examinations of the pelvis were performed. Transabdominal technique was performed for global imaging of the pelvis including uterus, ovaries, adnexal regions, and pelvic cul-de-sac. It was necessary to proceed with endovaginal exam following the transabdominal exam to visualize the endometrium and ovaries. Color and duplex Doppler ultrasound was utilized to evaluate blood flow to the ovaries. COMPARISON:  None.  FINDINGS: Uterus Measurements: 7.1 x 3.1 x 3.6 cm = volume: 41 mL. No fibroids or other mass visualized. Endometrium Thickness: 2.8 mm.  No focal abnormality visualized. Right ovary Measurements: 3.6 x 2.7 x 2.4 cm = volume: 12 mL. Normal appearance/no adnexal mass. Left ovary Measurements: 3.6 x 2.5 x 2.2 cm = volume: 10 mL. Normal appearance/no adnexal mass. Pulsed Doppler evaluation of both ovaries demonstrates normal low-resistance arterial and venous waveforms. Other findings Trace physiologic fluid in the pelvis. IMPRESSION: 1. The endometrial stripe thickness is normal for age. If bleeding remains unresponsive to hormonal or medical therapy, sonohysterogram should be considered for focal lesion work-up. (Ref: Radiological Reasoning: Algorithmic Workup of Abnormal Vaginal Bleeding with Endovaginal Sonography and Sonohysterography. AJR 2008; [id number]:[id number]) 2. The ovaries are normal in appearance.  No evidence of torsion. Electronically Signed   By: Dorise Bullion III M.D   On: 06/07/2020 18:47    ____________________________________________    PROCEDURES  Procedure(s) performed:    Procedures    Medications - No data to display   ____________________________________________   INITIAL IMPRESSION / ASSESSMENT AND PLAN / ED COURSE  Pertinent labs & imaging results that were available during my care of the patient were reviewed by me and considered in my medical decision making (see chart for details).  Review of the [place] CSRS was performed in accordance of the [facility] prior to dispensing any controlled drugs.           Patient's diagnosis is consistent with menorrhagia with irregular cycle.  Patient presented to emergency department complaining of abdominal cramping and heavier than normal vaginal bleeding.  Patient has irregular periods.  Patient denies any discharge.  No flank pain.  No urinary or GI symptoms.  No fevers or chills.  Exam was overall reassuring.  Imaging and results are  reassuring at this time.  Patient is currently trying to be pregnant and at this time no birth control will be started.  Follow-up with OB/GYN as needed. Patient is given ED precautions to return to the ED for any worsening or new symptoms.     ____________________________________________  FINAL CLINICAL IMPRESSION(S) / ED DIAGNOSES  Final diagnoses:  Vaginal bleeding  Pelvic pain  Menorrhagia with irregular cycle      NEW MEDICATIONS STARTED DURING THIS VISIT:  ED Discharge Orders    None          This chart was dictated using voice recognition software/Dragon. Despite best efforts to proofread, errors  can occur which can change the meaning. Any change was purely unintentional.    Darletta Moll, PA-C 06/07/20 2053    Duffy Bruce, MD 06/09/20 [phone]

## 2020-06-07 NOTE — ED Notes (Signed)
Po fluids and snack provided.  

## 2020-06-07 NOTE — ED Triage Notes (Signed)
Patient reports severe menstrual cramp pain. Reports irregular periods. Negative pregnancy test last week.

## 2020-06-07 NOTE — ED Notes (Signed)
Pt ringing call bell to ask "how much longer" and report she has not eaten today. Pt updated.

## 2020-06-10 LAB — POCT PREGNANCY, URINE: Preg Test, Ur: NEGATIVE

## 2020-07-02 ENCOUNTER — Other Ambulatory Visit: Payer: Medicaid Other

## 2020-07-02 ENCOUNTER — Ambulatory Visit: Payer: Medicaid Other | Attending: Oncology

## 2020-07-16 ENCOUNTER — Ambulatory Visit: Payer: Medicaid Other

## 2020-07-29 ENCOUNTER — Ambulatory Visit: Payer: Medicaid Other | Attending: Oncology

## 2020-07-30 ENCOUNTER — Telehealth: Payer: Self-pay | Admitting: *Deleted

## 2021-10-07 ENCOUNTER — Ambulatory Visit: Payer: Medicaid Other

## 2021-11-26 ENCOUNTER — Encounter: Payer: Medicaid Other | Admitting: Obstetrics and Gynecology

## 2021-12-07 ENCOUNTER — Other Ambulatory Visit: Payer: Self-pay | Admitting: Family Medicine

## 2021-12-07 DIAGNOSIS — N6310 Unspecified lump in the right breast, unspecified quadrant: Secondary | ICD-10-CM

## 2021-12-16 ENCOUNTER — Other Ambulatory Visit: Payer: Self-pay

## 2021-12-16 ENCOUNTER — Ambulatory Visit
Admission: RE | Admit: 2021-12-16 | Discharge: 2021-12-16 | Disposition: A | Discharge status: Home / Self Care | Payer: Self-pay | Source: Ambulatory Visit | Attending: Oncology | Admitting: Oncology

## 2021-12-16 ENCOUNTER — Encounter: Payer: Self-pay | Admitting: *Deleted

## 2021-12-16 ENCOUNTER — Ambulatory Visit: Payer: Medicaid Other | Attending: Oncology | Admitting: *Deleted

## 2021-12-16 ENCOUNTER — Other Ambulatory Visit: Payer: Self-pay | Admitting: *Deleted

## 2021-12-16 VITALS — BP 126/86 | HR 65 | Temp 97.5°F | Ht 63.5 in | Wt 217.0 lb

## 2021-12-16 DIAGNOSIS — N6332 Unspecified lump in axillary tail of the left breast: Secondary | ICD-10-CM | POA: Insufficient documentation

## 2021-12-16 NOTE — Progress Notes (Signed)
Subjective:     Patient ID: Paige White, female   DOB: March 18, 1991, 30 y.o.   MRN: [id number]  HPI  [facility] Medical History Record - 12/16/21 7026       Breast History   Screening cycle Rescreen    [facility] Date 11/25/21    Provider (CBE) [place] Date 08/14/19    Provider (Mammogram)  [place] Poli    Recent Breast Symptoms Lump   left breast mass with "burning pain"     Breast Cancer History   Comments/Details paternal grandmother in her 49's, and great grandmother in her late 64's - maternal grandmother breast cancer, lung cancer      Previous History of Breast Problems   Breast Surgery or Biopsy None    Breast Implants N/A    BSE Done Monthly      Gynecological/Obstetrical History   LMP 04/19/21   "always had irregular periods"   Is there any chance that the client could be pregnant?  No    Age at menarche 36    Age at menopause NA    PAP smear history Annually    Date of last PAP  10/14/21    Provider (PAP) [facility]    Age at first live birth NA    DES Exposure Unkown    Cervical, Uterine or Ovarian cancer No    Family history of Cervial, Uterine or Ovarian cancer No    Hysterectomy No    Cervix removed No    Ovaries removed No    Laser/Cryosurgery No    Current method of birth control None    Current method of Estrogen/Hormone replacement None    Smoking history Yes    Comments refused smoking cessation info               Review of Systems     Objective:   Physical Exam Chest:  Breasts:    Right: No swelling, bleeding, inverted nipple, mass, nipple discharge, skin change or tenderness.     Left: Mass present. No swelling, bleeding, inverted nipple, nipple discharge, skin change or tenderness.    Lymphadenopathy:     Upper Body:     Right upper body: No supraclavicular or axillary adenopathy.     Left upper body: No supraclavicular or axillary adenopathy.      Assessment:     30 year old female returns to [facility] for new  concerns of left breast pain.  Patient has not followed up with her right breast.  Patient states she started having intermittent left breast pain.  States the pain is like a burning sensation and radiates from her left axillary to her left nipple.  On clinical breast exam bilateral breast have scattered fibroglandular like tissue.  There is an approximate 1 cm nodule at 6:00 left breast.  There is also an approximate 3 cm tender thickening at the left axilla where the patient states is painful.  Taught self breast awareness.  Patient with a family history of breast cancer in her maternal and paternal grandmothers.  [place] risk assessment shows a 22.5% Lifetime risk of developing breast cancer.  Discussed referral to the high risk breast clinic and genetics.  Patient has been screened for eligibility.  She does not have any insurance, Medicare or Medicaid.  She also meets financial eligibility.       Plan:     Will get bilateral diagnostic mammogram and ultrasound for past follow up of the right and  new findings on the left breast.  Will discuss with Faith Rogue our genetic counselor if testing is appropriate or send directly to the high risk clinic.  Will follow up per [facility] protocol.

## 2021-12-18 ENCOUNTER — Other Ambulatory Visit: Payer: Self-pay | Admitting: *Deleted

## 2021-12-18 DIAGNOSIS — N6332 Unspecified lump in axillary tail of the left breast: Secondary | ICD-10-CM

## 2021-12-22 ENCOUNTER — Other Ambulatory Visit: Payer: Self-pay | Admitting: Oncology

## 2021-12-22 DIAGNOSIS — N6332 Unspecified lump in axillary tail of the left breast: Secondary | ICD-10-CM

## 2021-12-25 ENCOUNTER — Ambulatory Visit
Admission: RE | Admit: 2021-12-25 | Discharge: 2021-12-25 | Disposition: A | Discharge status: Home / Self Care | Payer: Self-pay | Source: Ambulatory Visit | Attending: Oncology | Admitting: Oncology

## 2021-12-25 ENCOUNTER — Other Ambulatory Visit: Payer: Self-pay

## 2021-12-25 DIAGNOSIS — N6332 Unspecified lump in axillary tail of the left breast: Secondary | ICD-10-CM

## 2021-12-25 HISTORY — PX: BREAST BIOPSY: SHX20

## 2021-12-28 ENCOUNTER — Encounter: Payer: Self-pay | Admitting: Oncology

## 2021-12-28 ENCOUNTER — Encounter: Payer: Self-pay | Admitting: *Deleted

## 2021-12-28 LAB — SURGICAL PATHOLOGY

## 2022-01-01 ENCOUNTER — Other Ambulatory Visit: Payer: Self-pay | Admitting: *Deleted

## 2022-01-01 ENCOUNTER — Encounter: Payer: Self-pay | Admitting: *Deleted

## 2022-01-01 DIAGNOSIS — Z9189 Other specified personal risk factors, not elsewhere classified: Secondary | ICD-10-CM

## 2022-01-01 NOTE — Progress Notes (Signed)
Called patient to review her pathology results and radiology recommendation for a clinical work-up.  Message sent to Dr. Grayland Ormond for recommendation to defer to PCP or med/onc for further work-up.  I discussed with patient that I still plan to send her to medical oncology to the high  risk clinic ,since her Tyrer-Cuzick breast cancer risk assessment showed a 22.5% lifetime risk of developing breast cancer.  She is in agreement with the plan.  I discussed that this would be considered self pay since [facility] does not cover these expenses.  Informed patient I would get back with her with Dr. Gary Fleet recommendation.   Order placed for the referral.

## 2022-01-05 ENCOUNTER — Encounter: Payer: Self-pay | Admitting: *Deleted

## 2022-01-05 NOTE — Progress Notes (Signed)
Received message back from Dr. Grayland Ormond recommending that patient see both PCP and the high risk clinic for further clinical evaluation of the lymphadenopathy and high risk for breast cancer.  Discussed with patient need for referral to her PCP.  She is currently in the process of changing PCP's but will let me know when she has one.  She is scheduled for the high risk clinic on 01/11/22

## 2022-01-06 ENCOUNTER — Encounter: Payer: Self-pay | Admitting: *Deleted

## 2022-01-11 ENCOUNTER — Other Ambulatory Visit: Payer: Self-pay | Admitting: *Deleted

## 2022-01-11 ENCOUNTER — Inpatient Hospital Stay: Payer: Medicaid Other

## 2022-01-11 ENCOUNTER — Encounter: Payer: Self-pay | Admitting: *Deleted

## 2022-01-11 ENCOUNTER — Inpatient Hospital Stay: Payer: Medicaid Other | Attending: Oncology | Admitting: Oncology

## 2022-01-11 ENCOUNTER — Other Ambulatory Visit: Payer: Self-pay

## 2022-01-11 ENCOUNTER — Encounter: Payer: Self-pay | Admitting: Oncology

## 2022-01-11 VITALS — BP 137/80 | HR 80 | Temp 96.7°F | Resp 16 | Ht 63.0 in | Wt 217.9 lb

## 2022-01-11 DIAGNOSIS — Z87891 Personal history of nicotine dependence: Secondary | ICD-10-CM

## 2022-01-11 DIAGNOSIS — N6321 Unspecified lump in the left breast, upper outer quadrant: Secondary | ICD-10-CM

## 2022-01-11 DIAGNOSIS — Z9189 Other specified personal risk factors, not elsewhere classified: Secondary | ICD-10-CM

## 2022-01-11 DIAGNOSIS — F32A Depression, unspecified: Secondary | ICD-10-CM | POA: Insufficient documentation

## 2022-01-11 DIAGNOSIS — Z9889 Other specified postprocedural states: Secondary | ICD-10-CM

## 2022-01-11 DIAGNOSIS — Z803 Family history of malignant neoplasm of breast: Secondary | ICD-10-CM

## 2022-01-11 NOTE — Progress Notes (Signed)
Dr. Tasia Catchings requested that patient have bilateral diagnostic mammogram and ultrasound for new mass at 2:00 left breast.  Orders placed through [facility].  Patient scheduled for 01/12/22 @ 11:20.  Patient aware of her appointment.

## 2022-01-11 NOTE — Progress Notes (Signed)
New pt visit followed by [facility], pt is high risk for breast cancer.  Had left breast.biopsy done 2 weeks ago.

## 2022-01-11 NOTE — Progress Notes (Signed)
Hematology/Oncology Consult note [phone] [phone]      Patient Care Team: Center, [facility] as PCP - General (General Practice) Rico Junker, RN as Registered Nurse Theodore Demark, RN as Registered Nurse  REFERRING PROVIDER: Lloyd Huger, MD  CHIEF COMPLAINTS/REASON FOR VISIT:  Evaluation of at risk of breast cancer.   HISTORY OF PRESENTING ILLNESS:   Paige White is a  31 y.o.  female with PMH listed below was seen in consultation at the request of  Grayland Ormond, Kathlene November, MD  for evaluation of at risk of breast cancer.   Family history positive for history of breast cancer in paternal grandmother and paternal great grandmother Patient states that she has left breast pain and felt burning sensations in the both axillary.  Patient was seen by [facility] for left breast pain.  Patient was evaluated by RN Vita Barley and on physical examination, there was 3 cm tender thickening at the left axilla about the patient stated was painful. 12/16/2021, ultrasound bilateral breast and axilla showed mildly abnormal nodes in both axilla.   Patient has stable mass in the right breast measuring 13 x 9 x 13 mm.  Size has not changed.  For mildly abnormal nodes in the right axilla measuring up to 5 mm.  Left breast showed no abnormalities.  For mildly abnormal lymph nodes measuring up to 6 mm Biopsy of one of the mildly abnormal left-sided lymph nodes were recommended. 12/29/2021, she underwent left axillary node core needle biopsy.  Lymph node showed paracortical hyperplasia and pigment-laden macrophages.  No morphologic or immunophenotypic evidence of malignancy.  Patient was referred to establish care with high risk breast cancer clinic. Menarche, 31 years of age.  Review of Systems  Constitutional:  Negative for appetite change, chills, fatigue and fever.  HENT:   Negative for hearing loss and voice change.   Eyes:  Negative for eye problems.   Respiratory:  Negative for chest tightness and cough.   Cardiovascular:  Negative for chest pain.  Gastrointestinal:  Negative for abdominal distention, abdominal pain and blood in stool.  Endocrine: Negative for hot flashes.  Genitourinary:  Negative for difficulty urinating and frequency.   Musculoskeletal:  Negative for arthralgias.  Skin:  Negative for itching and rash.  Neurological:  Negative for extremity weakness.  Hematological:  Negative for adenopathy.  Psychiatric/Behavioral:  Negative for confusion.    MEDICAL HISTORY:  Past Medical History:  Diagnosis Date   At high risk for breast cancer    Depression    Herpes    History of menorrhagia    Hypertension    Lymphadenopathy     SURGICAL HISTORY: Past Surgical History:  Procedure Laterality Date   BREAST BIOPSY Left 12/25/2021    SOCIAL HISTORY: Social History   Socioeconomic History   Marital status: Single    Spouse name: Not on file   Number of children: Not on file   Years of education: Not on file   Highest education level: Not on file  Occupational History   Not on file  Tobacco Use   Smoking status: Former    Packs/day: 0.50    Types: Cigarettes   Smokeless tobacco: Never   Tobacco comments:    Pt reports smoking marijuana on occasion.   Vaping Use   Vaping Use: Never used  Substance and Sexual Activity   Alcohol use: Yes    Alcohol/week: 4.0 standard drinks    Types: 2 Cans of beer, 2 Shots  of liquor per week   Drug use: Yes    Types: Marijuana   Sexual activity: Yes  Other Topics Concern   Not on file  Social History Narrative   Not on file   Social Determinants of Health   Financial Resource Strain: Not on file  Food Insecurity: Not on file  Transportation Needs: Not on file  Physical Activity: Not on file  Stress: Not on file  Social Connections: Not on file  Intimate Partner Violence: Not on file    FAMILY HISTORY: Family History  Problem Relation Age of Onset   Breast  cancer Paternal Grandmother    Breast cancer Other     ALLERGIES:  has No Known Allergies.  MEDICATIONS:  No current outpatient medications on file.   No current facility-administered medications for this visit.     PHYSICAL EXAMINATION: ECOG PERFORMANCE STATUS: 1 - Symptomatic but completely ambulatory Vitals:   01/11/22 1117  BP: 137/80  Pulse: 80  Resp: 16  Temp: (!) 96.7 F (35.9 C)  SpO2: 97%   Filed Weights   01/11/22 1117  Weight: 217 lb 14.4 oz (98.8 kg)    Physical Exam Constitutional:      General: She is not in acute distress. HENT:     Head: Normocephalic and atraumatic.  Eyes:     General: No scleral icterus. Cardiovascular:     Rate and Rhythm: Normal rate and regular rhythm.     Heart sounds: Normal heart sounds.  Pulmonary:     Effort: Pulmonary effort is normal. No respiratory distress.     Breath sounds: No wheezing.  Abdominal:     General: Bowel sounds are normal. There is no distension.     Palpations: Abdomen is soft.  Musculoskeletal:        General: No deformity. Normal range of motion.     Cervical back: Normal range of motion and neck supple.  Skin:    General: Skin is warm and dry.     Findings: No erythema or rash.  Neurological:     Mental Status: She is alert and oriented to person, place, and time. Mental status is at baseline.     Cranial Nerves: No cranial nerve deficit.     Coordination: Coordination normal.  Psychiatric:        Mood and Affect: Mood normal.  Bilateral breast was examined. Not able to feel any discrete mass in the right breast. Left breast upper outer quadrant about 2:00 small  lesion. No palpable lymphadenopathy bilaterally.  LABORATORY DATA:  I have reviewed the data as listed Lab Results  Component Value Date   WBC 6.7 06/07/2020   HGB 14.4 06/07/2020   HCT 42.4 06/07/2020   MCV 83.8 06/07/2020   PLT 336 06/07/2020   No results for input(s): NA, K, CL, CO2, GLUCOSE, BUN, CREATININE, CALCIUM,  GFRNONAA, GFRAA, PROT, ALBUMIN, AST, ALT, ALKPHOS, BILITOT, BILIDIR, IBILI in the last 8760 hours. Iron/TIBC/Ferritin/ %Sat No results found for: IRON, TIBC, FERRITIN, IRONPCTSAT    RADIOGRAPHIC STUDIES: I have personally reviewed the radiological images as listed and agreed with the findings in the report. US BREAST LTD UNI LEFT INC AXILLA  Result Date: 12/16/2021 CLINICAL DATA:  Two year follow-up a right breast mass. Pain in both axillary regions, shooting to the nipples. Palpable lump in the left breast at 6 o'clock. EXAM: ULTRASOUND OF THE BILATERAL BREAST COMPARISON:  Previous exam(s). FINDINGS: Targeted ultrasound is performed, showing a stable mass in the right breast measuring  13 x 9 x 13 mm today versus 15 x 9 x 13 mm previously. Four mildly abnormal lymph nodes are seen in the right axilla with cortices measuring up to 5 mm. Ultrasound of the inferior left breast at the site of the patient's symptoms demonstrates no abnormalities. There is an [place] of dense glandular tissue in this region. Ultrasound of the left axilla demonstrates 4 mildly abnormal lymph nodes with cortices measuring up to 6 mm. IMPRESSION: Mildly abnormal nodes in both axilla. No known underlying medical illness or recent illness to explain these nodes. Stable right breast mass, requiring no additional follow-up. No cause for the patient's 6 o'clock left lump identified. RECOMMENDATION: Recommend ultrasound-guided biopsy of 1 of the mildly abnormal left-sided lymph nodes. Treatment of the patient's left-sided lump should be based on clinical and physical exam given lack of imaging findings. The patient is high risk for breast cancer. Her grandmother was diagnosed with breast cancer in her early 57s. Recommend annual screening mammography and breast MRI beginning beginning at an age 40 years earlier than the age of diagnosis of her grandmother. This would have the patient beginning screening in her early 73s. If the age of  diagnosis of her grandmother is not known, recommend beginning screening breast MRI and mammography at the age of 36. I have discussed the findings and recommendations with the patient. If applicable, a reminder letter will be sent to the patient regarding the next appointment. BI-RADS CATEGORY  4: Suspicious. Electronically Signed   By: Dorise Bullion III M.D.   On: 12/16/2021 10:08  US BREAST LTD UNI RIGHT INC AXILLA  Result Date: 12/16/2021 CLINICAL DATA:  Two year follow-up a right breast mass. Pain in both axillary regions, shooting to the nipples. Palpable lump in the left breast at 6 o'clock. EXAM: ULTRASOUND OF THE BILATERAL BREAST COMPARISON:  Previous exam(s). FINDINGS: Targeted ultrasound is performed, showing a stable mass in the right breast measuring 13 x 9 x 13 mm today versus 15 x 9 x 13 mm previously. Four mildly abnormal lymph nodes are seen in the right axilla with cortices measuring up to 5 mm. Ultrasound of the inferior left breast at the site of the patient's symptoms demonstrates no abnormalities. There is an [place] of dense glandular tissue in this region. Ultrasound of the left axilla demonstrates 4 mildly abnormal lymph nodes with cortices measuring up to 6 mm. IMPRESSION: Mildly abnormal nodes in both axilla. No known underlying medical illness or recent illness to explain these nodes. Stable right breast mass, requiring no additional follow-up. No cause for the patient's 6 o'clock left lump identified. RECOMMENDATION: Recommend ultrasound-guided biopsy of 1 of the mildly abnormal left-sided lymph nodes. Treatment of the patient's left-sided lump should be based on clinical and physical exam given lack of imaging findings. The patient is high risk for breast cancer. Her grandmother was diagnosed with breast cancer in her early 73s. Recommend annual screening mammography and breast MRI beginning beginning at an age 12 years earlier than the age of diagnosis of her grandmother. This  would have the patient beginning screening in her early 41s. If the age of diagnosis of her grandmother is not known, recommend beginning screening breast MRI and mammography at the age of 53. I have discussed the findings and recommendations with the patient. If applicable, a reminder letter will be sent to the patient regarding the next appointment. BI-RADS CATEGORY  4: Suspicious. Electronically Signed   By: Dorise Bullion III M.D.   On:  12/16/2021 10:08  [place] AXILLARY NODE CORE BIOPSY LEFT  Addendum Date: 12/29/2021   ADDENDUM REPORT: 12/29/2021 12:58 ADDENDUM: PATHOLOGY revealed: A. LYMPH NODE, LEFT AXILLA; ULTRASOUND-GUIDED BIOPSY: - LYMPH NODE WITH PARACORTICAL HYPERPLASIA AND PIGMENT-LADEN MACROPHAGES, SEE COMMENT. - NO MORPHOLOGIC OR IMMUNOPHENOTYPIC EVIDENCE OF MALIGNANCY. Comment: Material was submitted to [facility] for Molecular Biology and Pathology for flow cytometric analysis. Per report, no monoclonal B-cells and no atypical T-cell population were detected (see scanned report in [facility]). The above findings could be compatible with dermatopathic lymphadenitis in the appropriate clinical setting. Pathology results are CONCORDANT with imaging findings, per Dr. Dorise Bullion. Pathology results and recommendations were discussed with patient via telephone on 12/28/2021. Patient reported biopsy site doing well with no adverse symptoms, and only slight tenderness at the site. Post biopsy care instructions were reviewed, questions were answered and my direct phone number was provided. Patient was instructed to call [facility] for any additional questions or concerns related to biopsy site. Recommendation: Clinical follow-up with provider for workup evaluation, continue self breast examinations, and begin annual bilateral screening mammogram at age 65. Pathology results reported by Electa Sniff RN on 12/29/2021. Electronically Signed   By: Dorise Bullion III M.D.   On: 12/29/2021 12:58    Result Date: 12/29/2021 CLINICAL DATA:  Biopsy of a left axillary lymph node EXAM: [place] AXILLARY NODE CORE BIOPSY LEFT COMPARISON:  Previous exam(s). PROCEDURE: I met with the patient and we discussed the procedure of ultrasound-guided biopsy, including benefits and alternatives. We discussed the high likelihood of a successful procedure. We discussed the risks of the procedure, including infection, bleeding, tissue injury, clip migration, and inadequate sampling. Informed written consent was given. The usual time-out protocol was performed immediately prior to the procedure. Using sterile technique and 1% Lidocaine as local anesthetic, under direct ultrasound visualization, a 14 gauge spring-loaded device was used to perform biopsy of a left axillary lymph node using a lateral approach. At the conclusion of the procedure a HydroMARK tissue marker clip was deployed into the biopsy cavity. Follow up 2 view mammogram was performed and dictated separately. IMPRESSION: Ultrasound guided biopsy of a left axillary lymph node. No apparent complications. Electronically Signed: By: Dorise Bullion III M.D. On: 12/25/2021 08:43      ASSESSMENT & PLAN:  1. At high risk for breast cancer   2. Hx of left breast biopsy   3. Mass of upper outer quadrant of left breast    #Left breast lesion, family history of breast cancer. Recommend patient to establish care with genetic counseling.  She agrees.  Will refer. Ty[place] risk >20% without genetic testing.  Left breast lesion, will obtain left diagnostic mammogram Patient can follow-up 2 to 3 weeks after genetic testing to review results and breast cancer screening plan.  Orders Placed This Encounter  Procedures   MM DIAG BREAST TOMO UNI LEFT    Standing Status:   Future    Standing Expiration Date:   01/11/2023    Order Specific Question:   Reason for Exam (SYMPTOM  OR DIAGNOSIS REQUIRED)    Answer:   left upper outer quadrant mass, approx 0.5 cm    Order  Specific Question:   Is the patient pregnant?    Answer:   No    Order Specific Question:   Preferred imaging location?    Answer:   [place]   Ambulatory referral to Genetics    Referral Priority:   Routine    Referral Type:  Consultation    Referral Reason:   Specialty Services Required    Number of Visits Requested:   1    All questions were answered. The patient knows to call the clinic with any problems questions or concerns. Thank you for this kind referral and the opportunity to participate in the care of this patient. A copy of today's note is routed to referring provider   Earlie Server, MD, PhD [facility] Hematology Oncology 01/11/2022

## 2022-01-12 ENCOUNTER — Ambulatory Visit
Admission: RE | Admit: 2022-01-12 | Discharge: 2022-01-12 | Disposition: A | Discharge status: Home / Self Care | Payer: Medicaid Other | Source: Ambulatory Visit | Attending: Obstetrics and Gynecology | Admitting: Obstetrics and Gynecology

## 2022-01-12 DIAGNOSIS — N6321 Unspecified lump in the left breast, upper outer quadrant: Secondary | ICD-10-CM

## 2022-01-14 ENCOUNTER — Inpatient Hospital Stay: Payer: Medicaid Other

## 2022-01-14 ENCOUNTER — Inpatient Hospital Stay: Payer: Medicaid Other | Admitting: Licensed Clinical Social Worker

## 2022-02-04 ENCOUNTER — Inpatient Hospital Stay: Payer: Medicaid Other | Attending: Oncology | Admitting: Oncology

## 2022-02-19 IMAGING — US US BREAST*L* LIMITED INC AXILLA
1 series · 2 of 2 positions shown · non-contrast
Comparison: Previous exam(s).

CLINICAL DATA: Strong family history of breast cancer with a
grandmother with breast cancer in her 30s. Focal pain in the LEFT
upper outer breast.

EXAM:
ULTRASOUND OF THE LEFT BREAST

[Series 1: us breast*left* limited inc axilla · 0.09mm/px · 2 of 2 slices shown]
[im 1/2]
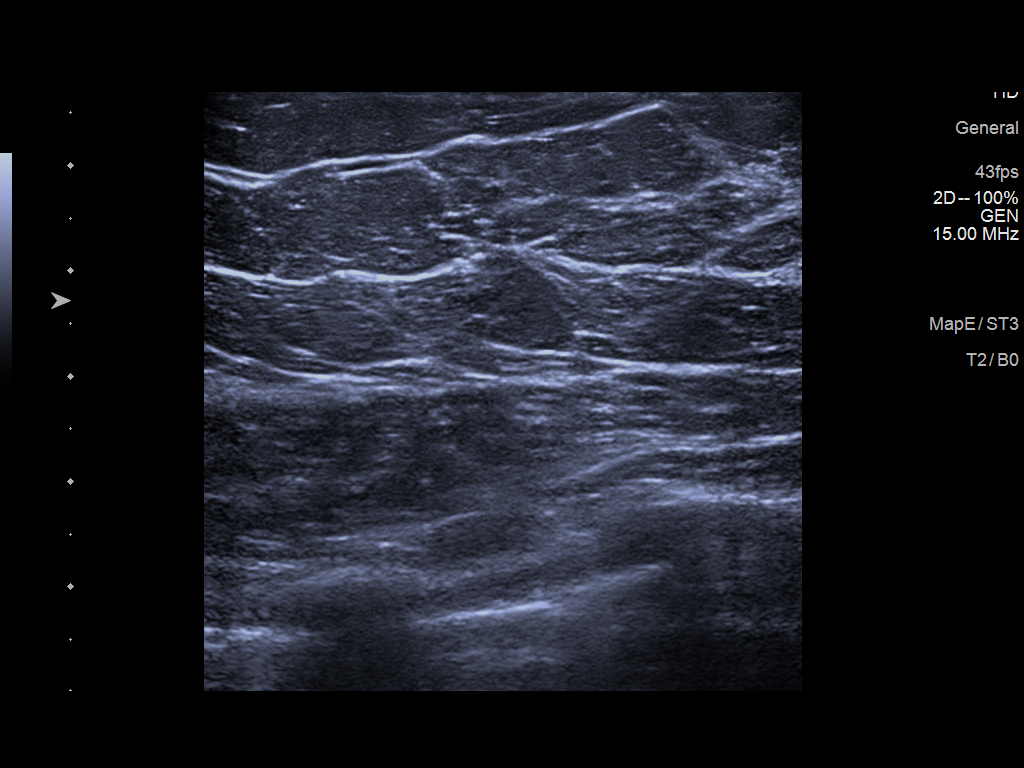
[im 2/2]
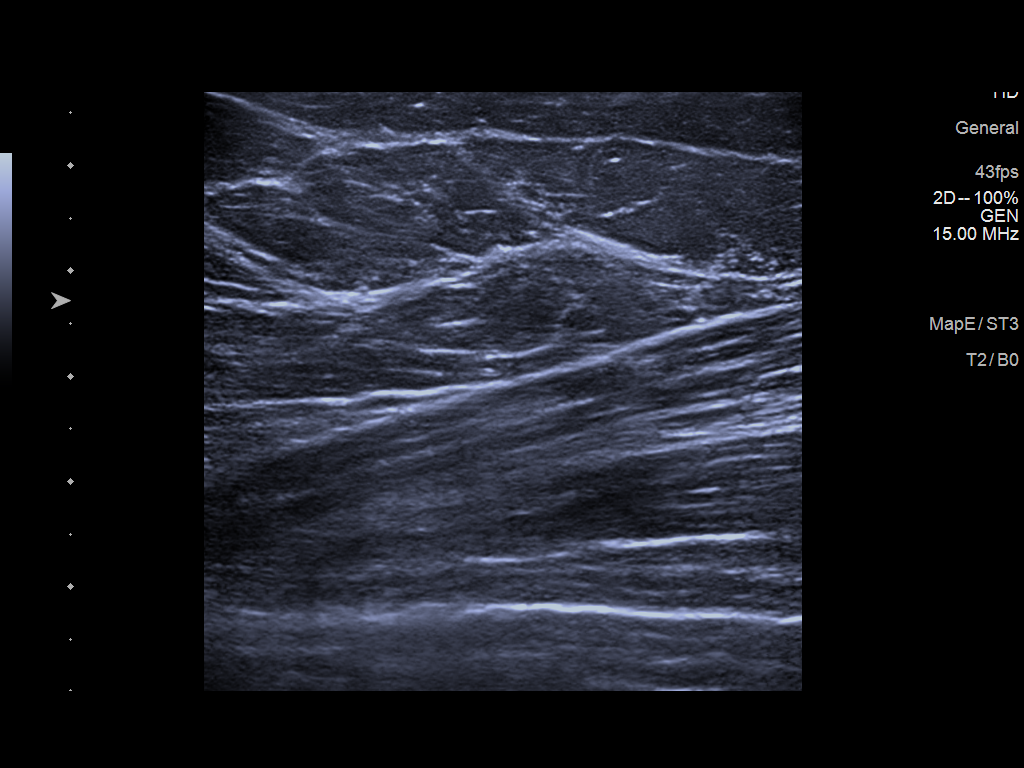

[2 of 2 positions shown; findings below may reference images not displayed]

FINDINGS: On physical exam, no suspicious mass is appreciated.

Targeted ultrasound was performed of the site of focal pain in the
LEFT upper outer breast. No suspicious cystic or solid mass is seen.
No suspicious sonographic etiology for focal LEFT breast pain is
identified.
IMPRESSION: No sonographic evidence of malignancy at the site of focal pain in
the LEFT breast. Any further workup of the patient's symptoms should
be based on the clinical assessment. Given strong family history of
breast cancer, recommend evaluation for eligibility for initiation
of high risk screening protocol. This would be with breast MRI with
and without contrast annually from the age 25 onwards with addition
of annual screening mammogram at the age of 30.

RECOMMENDATION:
Recommend evaluation for candidacy for initiation of high risk
screening protocol.

The American Cancer Society recommends annual MRI (starting at age
25) and mammography (starting at age 30) in patients with an
estimated lifetime risk of developing breast cancer greater than 20
- 25%, or who are known or suspected to be positive for the breast
cancer gene.

I have discussed the findings and recommendations with the patient.
If applicable, a reminder letter will be sent to the patient
regarding the next appointment.

BI-RADS CATEGORY  1: Negative.

## 2023-04-17 ENCOUNTER — Encounter: Payer: Self-pay | Admitting: Emergency Medicine

## 2023-04-17 ENCOUNTER — Other Ambulatory Visit: Payer: Self-pay

## 2023-04-17 DIAGNOSIS — W57XXXA Bitten or stung by nonvenomous insect and other nonvenomous arthropods, initial encounter: Secondary | ICD-10-CM | POA: Insufficient documentation

## 2023-04-17 DIAGNOSIS — S60462A Insect bite (nonvenomous) of right middle finger, initial encounter: Secondary | ICD-10-CM | POA: Insufficient documentation

## 2023-04-17 DIAGNOSIS — I10 Essential (primary) hypertension: Secondary | ICD-10-CM | POA: Insufficient documentation

## 2023-04-17 NOTE — ED Triage Notes (Signed)
Patient reports possibly being bit by a black spider on her right middle finger earlier today.  Patient reports swelling to the finger.  Patient has slight swelling to that finger, denies any other symptoms.

## 2023-04-18 ENCOUNTER — Emergency Department
Admission: EM | Admit: 2023-04-18 | Discharge: 2023-04-18 | Disposition: A | Discharge status: Home / Self Care | Payer: Medicaid Other | Attending: Emergency Medicine | Admitting: Emergency Medicine

## 2023-04-18 DIAGNOSIS — S60462A Insect bite (nonvenomous) of right middle finger, initial encounter: Secondary | ICD-10-CM

## 2023-04-18 MED ORDER — IBUPROFEN 800 MG PO TABS
800.0000 mg | ORAL_TABLET | Freq: Once | ORAL | Status: AC
Start: 2023-04-18 — End: 2023-04-18
  Administered 2023-04-18: 800 mg via ORAL
  Filled 2023-04-18: qty 1

## 2023-04-18 MED ORDER — AMOXICILLIN 500 MG PO CAPS
500.0000 mg | ORAL_CAPSULE | Freq: Once | ORAL | Status: AC
Start: 2023-04-18 — End: 2023-04-18
  Administered 2023-04-18: 500 mg via ORAL
  Filled 2023-04-18: qty 1

## 2023-04-18 MED ORDER — AMOXICILLIN 500 MG PO CAPS: 500.0000 mg | ORAL_CAPSULE | Freq: Three times a day (TID) | ORAL | 0 refills | Status: DC

## 2023-04-18 MED ORDER — SULFAMETHOXAZOLE-TRIMETHOPRIM 800-160 MG PO TABS
2.0000 | ORAL_TABLET | Freq: Once | ORAL | Status: AC
  Administered 2023-04-18: 2 via ORAL
  Filled 2023-04-18: qty 2

## 2023-04-18 MED ORDER — SULFAMETHOXAZOLE-TRIMETHOPRIM 800-160 MG PO TABS: 2.0000 | ORAL_TABLET | Freq: Two times a day (BID) | ORAL | 0 refills | Status: DC

## 2023-04-18 NOTE — Discharge Instructions (Signed)
Take and finish antibiotics as instructed. Wear splint as needed for comfort. Return to the ER for worsening symptoms or other concerns.

## 2023-04-18 NOTE — ED Provider Notes (Signed)
[facility] Provider Note    Event Date/Time   First MD Initiated Contact with Patient 04/18/23 0155     (approximate)   History   Insect Bite   HPI  Paige White is a 32 y.o. female who presents to the ED from home with a chief complaint of possible spider bite.  Patient reached her hand into something last evening, felt a sharp sting to her right third digit on the palmar surface, recoiled her hand and saw a black spider.  Presents with mild swelling to her dominant hand, third digit.  Denies airway difficulty, abdominal pain, nausea/vomiting or dizziness.  Voices no other complaints or injuries.     Past Medical History   Past Medical History:  Diagnosis Date   At high risk for breast cancer    Depression    Herpes    History of menorrhagia    Hypertension    Lymphadenopathy      Active Problem List   Patient Active Problem List   Diagnosis Date Noted   Depression 01/11/2022   Hx of left breast biopsy 01/11/2022     Past Surgical History   Past Surgical History:  Procedure Laterality Date   BREAST BIOPSY Left 12/25/2021     Home Medications   Prior to Admission medications   Medication Sig Start Date End Date Taking? Authorizing Provider  amoxicillin (AMOXIL) 500 MG capsule Take 1 capsule (500 mg total) by mouth 3 (three) times daily. 04/18/23  Yes Irean Hong, MD  sulfamethoxazole-trimethoprim (BACTRIM DS) 800-160 MG tablet Take 2 tablets by mouth 2 (two) times daily. 04/18/23  Yes Irean Hong, MD     Allergies  Patient has no known allergies.   Family History   Family History  Problem Relation Age of Onset   Breast cancer Paternal Grandmother    Breast cancer Other      Physical Exam  Triage Vital Signs: ED Triage Vitals  Enc Vitals Group     BP 04/17/23 2334 (!) 144/83     Pulse Rate 04/17/23 2334 83     Resp 04/17/23 2334 18     Temp 04/17/23 2334 99.1 F (37.3 C)     Temp Source 04/17/23 2334 Oral      SpO2 04/17/23 2334 100 %     Weight 04/17/23 2341 209 lb (94.8 kg)     Height 04/17/23 2341 5\' 3"  (1.6 m)     Head Circumference --      Peak Flow --      Pain Score 04/17/23 2341 1     Pain Loc --      Pain Edu? --      Excl. in GC? --     Updated Vital Signs: BP (!) 144/83 (BP Location: Left Arm)   Pulse 83   Temp 99.1 F (37.3 C) (Oral)   Resp 18   Ht 5\' 3"  (1.6 m)   Wt 94.8 kg   LMP 04/10/2023 (Approximate)   SpO2 100%   BMI 37.02 kg/m    General: Awake, no distress.  CV:  Good peripheral perfusion.  Resp:  Normal effort.  Abd:  No distention.  Other:  Right third finger with mild symmetrical swelling.  No obvious bite mark or fang mark noted.  Full range of motion without pain.  2+ radial pulses.  Brisk, less than 5-second cap refill.   ED Results / Procedures / Treatments  Labs (all labs ordered are listed,  but only abnormal results are displayed) Labs Reviewed - No data to display   EKG  None   RADIOLOGY None   Official radiology report(s): No results found.   PROCEDURES:  Critical Care performed: No  Procedures   MEDICATIONS ORDERED IN ED: Medications  amoxicillin (AMOXIL) capsule 500 mg (500 mg Oral Given 04/18/23 0224)  sulfamethoxazole-trimethoprim (BACTRIM DS) 800-160 MG per tablet 2 tablet (2 tablets Oral Given 04/18/23 0224)  ibuprofen (ADVIL) tablet 800 mg (800 mg Oral Given 04/18/23 0224)     IMPRESSION / MDM / ASSESSMENT AND PLAN / ED COURSE  I reviewed the triage vital signs and the nursing notes.                             32 year old female presenting with possible spider bite to right middle digit.  Clinically does not sound like it was a black widow envenomation.  Will place on double coverage antibiotics out of an abundance of caution to cover both garden-variety infection and also MRSA.  Finger splint placed for comfort.  Strict return precautions given.  Patient verbalizes understanding and agrees with plan of  care.  Patient's presentation is most consistent with acute, uncomplicated illness.   FINAL CLINICAL IMPRESSION(S) / ED DIAGNOSES   Final diagnoses:  Insect bite of right middle finger, initial encounter     Rx / DC Orders   ED Discharge Orders          Ordered    amoxicillin (AMOXIL) 500 MG capsule  3 times daily        04/18/23 0212    sulfamethoxazole-trimethoprim (BACTRIM DS) 800-160 MG tablet  2 times daily        04/18/23 0212             Note:  This document was prepared using Dragon voice recognition software and may include unintentional dictation errors.   Irean Hong, MD 04/18/23 [phone]

## 2023-06-22 ENCOUNTER — Other Ambulatory Visit: Payer: Self-pay | Admitting: Obstetrics and Gynecology

## 2023-06-22 DIAGNOSIS — N921 Excessive and frequent menstruation with irregular cycle: Secondary | ICD-10-CM

## 2023-06-24 ENCOUNTER — Ambulatory Visit
Admission: RE | Admit: 2023-06-24 | Discharge: 2023-06-24 | Disposition: A | Discharge status: Home / Self Care | Payer: Medicaid Other | Source: Ambulatory Visit | Attending: Obstetrics and Gynecology | Admitting: Obstetrics and Gynecology

## 2023-06-24 DIAGNOSIS — N921 Excessive and frequent menstruation with irregular cycle: Secondary | ICD-10-CM

## 2024-01-12 ENCOUNTER — Other Ambulatory Visit: Payer: Self-pay

## 2024-01-12 ENCOUNTER — Emergency Department
Admission: EM | Admit: 2024-01-12 | Discharge: 2024-01-12 | Disposition: A | Discharge status: Home / Self Care | Payer: MEDICAID | Attending: Emergency Medicine | Admitting: Emergency Medicine

## 2024-01-12 DIAGNOSIS — R1084 Generalized abdominal pain: Secondary | ICD-10-CM | POA: Diagnosis not present

## 2024-01-12 DIAGNOSIS — R112 Nausea with vomiting, unspecified: Secondary | ICD-10-CM | POA: Insufficient documentation

## 2024-01-12 DIAGNOSIS — R197 Diarrhea, unspecified: Secondary | ICD-10-CM | POA: Diagnosis not present

## 2024-01-12 LAB — COMPREHENSIVE METABOLIC PANEL
ALT: 20 U/L (ref 0–44)
AST: 22 U/L (ref 15–41)
Albumin: 3.7 g/dL (ref 3.5–5.0)
Alkaline Phosphatase: 34 U/L — ABNORMAL LOW (ref 38–126)
Anion gap: 8 (ref 5–15)
BUN: 13 mg/dL (ref 6–20)
CO2: 26 mmol/L (ref 22–32)
Calcium: 8.7 mg/dL — ABNORMAL LOW (ref 8.9–10.3)
Chloride: 101 mmol/L (ref 98–111)
Creatinine, Ser: 0.84 mg/dL (ref 0.44–1.00)
GFR, Estimated: >60 mL/min (ref >60)
Glucose, Bld: 115 mg/dL — ABNORMAL HIGH (ref 70–99)
Potassium: 4.2 mmol/L (ref 3.5–5.1)
Sodium: 135 mmol/L (ref 135–145)
Total Bilirubin: 0.8 mg/dL (ref 0.0–1.2)
Total Protein: 7.6 g/dL (ref 6.5–8.1)

## 2024-01-12 LAB — URINALYSIS, ROUTINE W REFLEX MICROSCOPIC
Bacteria, UA: NONE SEEN
Bilirubin Urine: NEGATIVE
Glucose, UA: NEGATIVE mg/dL
Hgb urine dipstick: NEGATIVE
Ketones, ur: NEGATIVE mg/dL
Leukocytes,Ua: NEGATIVE
Nitrite: NEGATIVE
Protein, ur: 30 mg/dL — AB
Specific Gravity, Urine: 1.019 (ref 1.005–1.030)
pH: 8 (ref 5.0–8.0)

## 2024-01-12 LAB — CBC
HCT: 41.7 % (ref 36.0–46.0)
Hemoglobin: 13.6 g/dL (ref 12.0–15.0)
MCH: 27.9 pg (ref 26.0–34.0)
MCHC: 32.6 g/dL (ref 30.0–36.0)
MCV: 85.5 fL (ref 80.0–100.0)
Platelets: 273 10*3/uL (ref 150–400)
RBC: 4.88 MIL/uL (ref 3.87–5.11)
RDW: 12.5 % (ref 11.5–15.5)
WBC: 7.8 10*3/uL (ref 4.0–10.5)
nRBC: 0 % (ref 0.0–0.2)

## 2024-01-12 LAB — POC URINE PREG, ED
Preg Test, Ur: NEGATIVE
Preg Test, Ur: NEGATIVE

## 2024-01-12 LAB — LIPASE, BLOOD: Lipase: 33 U/L (ref 11–51)

## 2024-01-12 MED ORDER — DICYCLOMINE HCL 10 MG PO CAPS: 10.0000 mg | ORAL_CAPSULE | Freq: Three times a day (TID) | ORAL | 0 refills | Status: AC | PRN

## 2024-01-12 MED ORDER — ONDANSETRON 4 MG PO TBDP
4.0000 mg | ORAL_TABLET | Freq: Once | ORAL | Status: AC
  Administered 2024-01-12: 4 mg via ORAL
  Filled 2024-01-12: qty 1

## 2024-01-12 MED ORDER — DICYCLOMINE HCL 10 MG PO CAPS
10.0000 mg | ORAL_CAPSULE | Freq: Once | ORAL | Status: AC
  Administered 2024-01-12: 10 mg via ORAL
  Filled 2024-01-12: qty 1

## 2024-01-12 MED ORDER — ONDANSETRON 4 MG PO TBDP: 4.0000 mg | ORAL_TABLET | Freq: Three times a day (TID) | ORAL | 0 refills | Status: AC | PRN

## 2024-01-12 MED ORDER — ACETAMINOPHEN 500 MG PO TABS
1000.0000 mg | ORAL_TABLET | Freq: Once | ORAL | Status: AC
  Administered 2024-01-12: 1000 mg via ORAL
  Filled 2024-01-12: qty 2

## 2024-01-12 NOTE — Discharge Instructions (Addendum)
Please return if you have persistent or worsening symptoms, dehydration, lightheadedness, if you pass out, severe abdominal pain, or if you have any additional concerns.

## 2024-01-12 NOTE — ED Notes (Signed)
See triage note  Presents with abd pain which started couple of weeks ago  Low grade temp on arrival

## 2024-01-12 NOTE — ED Provider Notes (Signed)
[facility] Provider Note    Event Date/Time   First MD Initiated Contact with Patient 01/12/24 [id number]     (approximate)   History   Abdominal Pain   HPI  Paige White is a 33 y.o. female with history of menorrhagia with irregular periods presenting with 2 weeks of crampy abdominal pain, noted some nausea vomiting and diarrhea that started today.  Denies any urinary symptoms, no fever, back pain, chest pain, shortness of breath, cough.  No other sick contacts.  Has not taken anything for the pain.  States that her last period was 2 months ago but this is pretty typical for her.  Denies any other medical history.  Does not take any medications on a regular basis.  Denies any vaginal discharge or bleeding.  On independent chart review she was seen by OB in July of last year for removal of her IUD due to pelvic cramping abdominal pain.     Physical Exam   Triage Vital Signs: ED Triage Vitals  Encounter Vitals Group     BP 01/12/24 0823 131/85     Systolic BP Percentile --      Diastolic BP Percentile --      Pulse Rate 01/12/24 0823 99     Resp 01/12/24 0821 17     Temp 01/12/24 0821 99.2 F (37.3 C)     Temp src --      SpO2 01/12/24 0823 100 %     Weight 01/12/24 0821 250 lb (113.4 kg)     Height 01/12/24 0821 5\' 3"  (1.6 m)     Head Circumference --      Peak Flow --      Pain Score 01/12/24 0820 10     Pain Loc --      Pain Education --      Exclude from Growth Chart --     Most recent vital signs: Vitals:   01/12/24 0821 01/12/24 0823  BP:  131/85  Pulse:  99  Resp: 17   Temp: 99.2 F (37.3 C)   SpO2:  100%     General: Awake, no distress.  CV:  Good peripheral perfusion.  Resp:  Normal effort.  Abd:  No distention.  Nontender to palpation.  No CVA tenderness Other:  Moving all 4 extremities   ED Results / Procedures / Treatments   Labs (all labs ordered are listed, but only abnormal results are displayed) Labs Reviewed   COMPREHENSIVE METABOLIC PANEL - Abnormal; Notable for the following components:      Result Value   Glucose, Bld 115 (*)    Calcium 8.7 (*)    Alkaline Phosphatase 34 (*)    All other components within normal limits  URINALYSIS, ROUTINE W REFLEX MICROSCOPIC - Abnormal; Notable for the following components:   Color, Urine YELLOW (*)    APPearance CLEAR (*)    Protein, ur 30 (*)    All other components within normal limits  LIPASE, BLOOD  CBC  POC URINE PREG, ED  POC URINE PREG, ED     PROCEDURES:  Critical Care performed: No  Procedures   MEDICATIONS ORDERED IN ED: Medications  acetaminophen (TYLENOL) tablet 1,000 mg (1,000 mg Oral Given 01/12/24 1005)  dicyclomine (BENTYL) capsule 10 mg (10 mg Oral Given 01/12/24 1005)  ondansetron (ZOFRAN-ODT) disintegrating tablet 4 mg (4 mg Oral Given 01/12/24 1006)     IMPRESSION / MDM / ASSESSMENT AND PLAN / ED COURSE  I reviewed the triage vital signs and the nursing notes.                              Differential diagnosis includes, but is not limited to, viral illness, norovirus, gastroenteritis, food poisoning, IBS, no abdominal pain on exam, no imaging is indicated at this time.  Will get labs, give her some Tylenol, Zofran, Bentyl.  Will p.o. challenge.  Shared decision making patient ICG is agreeable to plan.  Patient's presentation is most consistent with acute complicated illness / injury requiring diagnostic workup.  Patient presenting with crampy abdominal pain, nausea, vomiting, diarrhea, no pain on exam, received Bentyl, Tylenol and Zofran with improvement of symptoms.  Tolerating p.o.  Independent review of labs are noted elsewhere in the chart.  Shared decision making done with patient and she is agreeable plan for discharge, will follow-up with her primary care doctor outpatient, will give her a short prescription for Bentyl as well as Zofran.  Considered but no indication for additional workup or inpatient admission at  this time, she is safe for outpatient management.  Discharge with strict return precautions.  Clinical Course as of 01/12/24 1104  Thu Jan 12, 2024  1610 Urinalysis, Routine w reflex microscopic -Urine, Clean Catch(!) Not consistent with UTI [TT]  0943 Comprehensive metabolic panel(!) Electrolytes not severely deranged [TT]  0943 CBC No leukocytosis [TT]  0943 Lipase, blood Normal [TT]  0943 Preg Test, Ur: NEGATIVE Negative [TT]  1101 On reassessment patient is tolerating p.o., feels significantly better after the medications.  Give her prescription for Bentyl as well as Zofran.  States she has a primary care doctor she can follow-up with. [TT]    Clinical Course User Index [TT] Claybon Jabs, MD     FINAL CLINICAL IMPRESSION(S) / ED DIAGNOSES   Final diagnoses:  Generalized abdominal pain  Nausea vomiting and diarrhea     Rx / DC Orders   ED Discharge Orders          Ordered    ondansetron (ZOFRAN-ODT) 4 MG disintegrating tablet  Every 8 hours PRN        01/12/24 0958    dicyclomine (BENTYL) 10 MG capsule  3 times daily PRN        01/12/24 1102             Note:  This document was prepared using Dragon voice recognition software and may include unintentional dictation errors.    Claybon Jabs, MD 01/12/24 [phone]

## 2024-01-12 NOTE — ED Triage Notes (Signed)
Pt to ED for abd pain x2 weeks. Reports emesis started this am. Also states has not had menstrual cycle in 3 months.

## 2024-06-15 ENCOUNTER — Ambulatory Visit: Payer: Self-pay | Admitting: Student

## 2024-07-19 ENCOUNTER — Other Ambulatory Visit: Payer: Self-pay

## 2024-07-19 DIAGNOSIS — Z1231 Encounter for screening mammogram for malignant neoplasm of breast: Secondary | ICD-10-CM

## 2024-07-31 ENCOUNTER — Other Ambulatory Visit: Payer: Self-pay | Admitting: Medical Genetics

## 2024-08-07 ENCOUNTER — Ambulatory Visit
Admission: RE | Admit: 2024-08-07 | Discharge: 2024-08-07 | Disposition: A | Discharge status: Home / Self Care | Source: Ambulatory Visit

## 2024-08-07 ENCOUNTER — Other Ambulatory Visit
Admission: RE | Admit: 2024-08-07 | Discharge: 2024-08-07 | Disposition: A | Discharge status: Home / Self Care | Source: Ambulatory Visit | Attending: Medical Genetics | Admitting: Medical Genetics

## 2024-08-07 DIAGNOSIS — Z1231 Encounter for screening mammogram for malignant neoplasm of breast: Secondary | ICD-10-CM | POA: Insufficient documentation

## 2024-08-17 LAB — GENECONNECT MOLECULAR SCREEN: Genetic Analysis Overall Interpretation: NEGATIVE

## 2025-03-12 ENCOUNTER — Ambulatory Visit: Admitting: Family Medicine

## 2025-03-28 ENCOUNTER — Encounter: Admitting: Obstetrics and Gynecology
